# Patient Record
Sex: Female | Born: 1965 | Hispanic: No | Smoking: Current every day smoker
Health system: Southern US, Community
[De-identification: ages and names within clinical notes are randomized; demographics above are authoritative.]

## PROBLEM LIST (undated history)

## (undated) DIAGNOSIS — F419 Anxiety disorder, unspecified: Secondary | ICD-10-CM

## (undated) DIAGNOSIS — M539 Dorsopathy, unspecified: Secondary | ICD-10-CM

## (undated) DIAGNOSIS — F329 Major depressive disorder, single episode, unspecified: Secondary | ICD-10-CM

## (undated) DIAGNOSIS — Z8639 Personal history of other endocrine, nutritional and metabolic disease: Secondary | ICD-10-CM

## (undated) DIAGNOSIS — M25519 Pain in unspecified shoulder: Secondary | ICD-10-CM

## (undated) DIAGNOSIS — F509 Eating disorder, unspecified: Secondary | ICD-10-CM

## (undated) DIAGNOSIS — D649 Anemia, unspecified: Secondary | ICD-10-CM

## (undated) DIAGNOSIS — R569 Unspecified convulsions: Secondary | ICD-10-CM

## (undated) DIAGNOSIS — F32A Depression, unspecified: Secondary | ICD-10-CM

## (undated) DIAGNOSIS — G8929 Other chronic pain: Secondary | ICD-10-CM

## (undated) DIAGNOSIS — I10 Essential (primary) hypertension: Secondary | ICD-10-CM

## (undated) DIAGNOSIS — I82409 Acute embolism and thrombosis of unspecified deep veins of unspecified lower extremity: Secondary | ICD-10-CM

## (undated) HISTORY — PX: KNEE SURGERY: SHX244

## (undated) HISTORY — DX: Acute embolism and thrombosis of unspecified deep veins of unspecified lower extremity: I82.409

## (undated) HISTORY — DX: Personal history of other endocrine, nutritional and metabolic disease: Z86.39

## (undated) HISTORY — DX: Major depressive disorder, single episode, unspecified: F32.9

## (undated) HISTORY — DX: Eating disorder, unspecified: F50.9

## (undated) HISTORY — DX: Depression, unspecified: F32.A

## (undated) HISTORY — PX: MULTIPLE TOOTH EXTRACTIONS: SHX2053

## (undated) HISTORY — DX: Anxiety disorder, unspecified: F41.9

---

## 1999-12-01 ENCOUNTER — Emergency Department (HOSPITAL_COMMUNITY): Admission: EM | Admit: 1999-12-01 | Discharge: 1999-12-01 | Payer: Self-pay | Admitting: Emergency Medicine

## 1999-12-01 ENCOUNTER — Encounter: Payer: Self-pay | Admitting: Emergency Medicine

## 2000-05-12 ENCOUNTER — Other Ambulatory Visit: Admission: RE | Admit: 2000-05-12 | Discharge: 2000-05-12 | Payer: Self-pay | Admitting: *Deleted

## 2000-06-20 ENCOUNTER — Encounter: Admission: RE | Admit: 2000-06-20 | Discharge: 2000-06-20 | Payer: Self-pay | Admitting: Family Medicine

## 2003-03-09 ENCOUNTER — Emergency Department (HOSPITAL_COMMUNITY): Admission: EM | Admit: 2003-03-09 | Discharge: 2003-03-09 | Payer: Self-pay | Admitting: Emergency Medicine

## 2003-05-24 ENCOUNTER — Other Ambulatory Visit: Admission: RE | Admit: 2003-05-24 | Discharge: 2003-05-24 | Payer: Self-pay | Admitting: Gynecology

## 2003-07-04 ENCOUNTER — Encounter: Admission: RE | Admit: 2003-07-04 | Discharge: 2003-08-10 | Payer: Self-pay | Admitting: Family Medicine

## 2004-05-22 ENCOUNTER — Emergency Department (HOSPITAL_COMMUNITY): Admission: EM | Admit: 2004-05-22 | Discharge: 2004-05-22 | Payer: Self-pay | Admitting: Emergency Medicine

## 2006-03-10 ENCOUNTER — Emergency Department (HOSPITAL_COMMUNITY): Admission: EM | Admit: 2006-03-10 | Discharge: 2006-03-10 | Payer: Self-pay | Admitting: Emergency Medicine

## 2006-04-09 ENCOUNTER — Emergency Department (HOSPITAL_COMMUNITY): Admission: EM | Admit: 2006-04-09 | Discharge: 2006-04-09 | Payer: Self-pay | Admitting: Emergency Medicine

## 2006-05-30 ENCOUNTER — Emergency Department (HOSPITAL_COMMUNITY): Admission: EM | Admit: 2006-05-30 | Discharge: 2006-05-30 | Payer: Self-pay | Admitting: Emergency Medicine

## 2006-06-28 ENCOUNTER — Emergency Department (HOSPITAL_COMMUNITY): Admission: EM | Admit: 2006-06-28 | Discharge: 2006-06-28 | Payer: Self-pay | Admitting: Emergency Medicine

## 2006-07-28 ENCOUNTER — Emergency Department (HOSPITAL_COMMUNITY): Admission: EM | Admit: 2006-07-28 | Discharge: 2006-07-28 | Payer: Self-pay | Admitting: Emergency Medicine

## 2006-09-03 ENCOUNTER — Emergency Department (HOSPITAL_COMMUNITY): Admission: EM | Admit: 2006-09-03 | Discharge: 2006-09-03 | Payer: Self-pay | Admitting: Emergency Medicine

## 2007-02-21 ENCOUNTER — Emergency Department (HOSPITAL_COMMUNITY): Admission: EM | Admit: 2007-02-21 | Discharge: 2007-02-21 | Payer: Self-pay | Admitting: Emergency Medicine

## 2007-03-03 ENCOUNTER — Emergency Department (HOSPITAL_COMMUNITY): Admission: EM | Admit: 2007-03-03 | Discharge: 2007-03-03 | Payer: Self-pay | Admitting: *Deleted

## 2007-03-18 ENCOUNTER — Emergency Department (HOSPITAL_COMMUNITY): Admission: EM | Admit: 2007-03-18 | Discharge: 2007-03-18 | Payer: Self-pay | Admitting: *Deleted

## 2007-06-21 ENCOUNTER — Emergency Department (HOSPITAL_COMMUNITY): Admission: EM | Admit: 2007-06-21 | Discharge: 2007-06-21 | Payer: Self-pay | Admitting: Emergency Medicine

## 2007-08-27 ENCOUNTER — Emergency Department (HOSPITAL_COMMUNITY): Admission: EM | Admit: 2007-08-27 | Discharge: 2007-08-27 | Payer: Self-pay | Admitting: Emergency Medicine

## 2007-09-21 ENCOUNTER — Emergency Department (HOSPITAL_COMMUNITY): Admission: EM | Admit: 2007-09-21 | Discharge: 2007-09-21 | Payer: Self-pay | Admitting: Family Medicine

## 2007-12-13 ENCOUNTER — Emergency Department (HOSPITAL_COMMUNITY): Admission: EM | Admit: 2007-12-13 | Discharge: 2007-12-13 | Payer: Self-pay | Admitting: Emergency Medicine

## 2008-04-29 ENCOUNTER — Emergency Department (HOSPITAL_COMMUNITY): Admission: EM | Admit: 2008-04-29 | Discharge: 2008-04-29 | Payer: Self-pay | Admitting: Emergency Medicine

## 2008-05-28 ENCOUNTER — Emergency Department (HOSPITAL_COMMUNITY): Admission: EM | Admit: 2008-05-28 | Discharge: 2008-05-28 | Payer: Self-pay | Admitting: Emergency Medicine

## 2008-07-27 ENCOUNTER — Emergency Department (HOSPITAL_COMMUNITY): Admission: EM | Admit: 2008-07-27 | Discharge: 2008-07-27 | Payer: Self-pay | Admitting: Emergency Medicine

## 2008-09-13 ENCOUNTER — Emergency Department (HOSPITAL_COMMUNITY): Admission: EM | Admit: 2008-09-13 | Discharge: 2008-09-13 | Payer: Self-pay | Admitting: Emergency Medicine

## 2008-10-21 ENCOUNTER — Emergency Department (HOSPITAL_COMMUNITY): Admission: EM | Admit: 2008-10-21 | Discharge: 2008-10-21 | Payer: Self-pay | Admitting: Emergency Medicine

## 2008-11-13 ENCOUNTER — Emergency Department (HOSPITAL_COMMUNITY): Admission: EM | Admit: 2008-11-13 | Discharge: 2008-11-13 | Payer: Self-pay | Admitting: Emergency Medicine

## 2008-11-18 ENCOUNTER — Emergency Department (HOSPITAL_COMMUNITY): Admission: EM | Admit: 2008-11-18 | Discharge: 2008-11-18 | Payer: Self-pay | Admitting: Emergency Medicine

## 2008-12-28 ENCOUNTER — Emergency Department (HOSPITAL_COMMUNITY): Admission: EM | Admit: 2008-12-28 | Discharge: 2008-12-28 | Payer: Self-pay | Admitting: Emergency Medicine

## 2009-01-09 ENCOUNTER — Emergency Department (HOSPITAL_COMMUNITY): Admission: EM | Admit: 2009-01-09 | Discharge: 2009-01-09 | Payer: Self-pay | Admitting: Emergency Medicine

## 2009-01-20 ENCOUNTER — Emergency Department (HOSPITAL_COMMUNITY): Admission: EM | Admit: 2009-01-20 | Discharge: 2009-01-20 | Payer: Self-pay | Admitting: Emergency Medicine

## 2009-03-07 ENCOUNTER — Emergency Department (HOSPITAL_COMMUNITY): Admission: EM | Admit: 2009-03-07 | Discharge: 2009-03-07 | Payer: Self-pay | Admitting: Emergency Medicine

## 2009-05-13 ENCOUNTER — Emergency Department (HOSPITAL_COMMUNITY): Admission: EM | Admit: 2009-05-13 | Discharge: 2009-05-13 | Payer: Self-pay | Admitting: Emergency Medicine

## 2009-11-06 ENCOUNTER — Emergency Department (HOSPITAL_COMMUNITY): Admission: EM | Admit: 2009-11-06 | Discharge: 2009-11-06 | Payer: Self-pay | Admitting: Emergency Medicine

## 2010-08-27 ENCOUNTER — Emergency Department (HOSPITAL_COMMUNITY)
Admission: EM | Admit: 2010-08-27 | Discharge: 2010-08-28 | Payer: Self-pay | Source: Home / Self Care | Admitting: Emergency Medicine

## 2010-08-28 LAB — DIFFERENTIAL
Basophils Absolute: 0 10*3/uL (ref 0.0–0.1)
Basophils Relative: 0 % (ref 0–1)
Eosinophils Absolute: 0.2 10*3/uL (ref 0.0–0.7)
Eosinophils Relative: 3 % (ref 0–5)
Monocytes Absolute: 0.6 10*3/uL (ref 0.1–1.0)
Monocytes Relative: 7 % (ref 3–12)

## 2010-08-28 LAB — URINE MICROSCOPIC-ADD ON

## 2010-08-28 LAB — CBC
HCT: 38.9 % (ref 36.0–46.0)
MCHC: 32.9 g/dL (ref 30.0–36.0)
Platelets: 259 10*3/uL (ref 150–400)
RBC: 4.69 MIL/uL (ref 3.87–5.11)
WBC: 8.1 10*3/uL (ref 4.0–10.5)

## 2010-08-28 LAB — POCT I-STAT, CHEM 8
Chloride: 104 mEq/L (ref 96–112)
Sodium: 142 mEq/L (ref 135–145)

## 2010-08-28 LAB — URINALYSIS, ROUTINE W REFLEX MICROSCOPIC
Specific Gravity, Urine: 1.031 — ABNORMAL HIGH (ref 1.005–1.030)
Urine Glucose, Fasting: NEGATIVE mg/dL
pH: 6 (ref 5.0–8.0)

## 2010-08-29 LAB — URINE CULTURE

## 2011-01-08 ENCOUNTER — Ambulatory Visit
Admission: RE | Admit: 2011-01-08 | Discharge: 2011-01-08 | Disposition: A | Discharge status: Home / Self Care | Payer: No Typology Code available for payment source | Source: Ambulatory Visit | Attending: Family Medicine | Admitting: Family Medicine

## 2011-01-08 ENCOUNTER — Other Ambulatory Visit: Payer: Self-pay | Admitting: Family Medicine

## 2011-01-08 DIAGNOSIS — Z201 Contact with and (suspected) exposure to tuberculosis: Secondary | ICD-10-CM

## 2011-04-25 LAB — URINE MICROSCOPIC-ADD ON

## 2011-04-25 LAB — COMPREHENSIVE METABOLIC PANEL
Albumin: 3.9
Alkaline Phosphatase: 79
Chloride: 107
GFR calc Af Amer: >60
GFR calc non Af Amer: >60
Potassium: 4
Sodium: 141
Total Bilirubin: 0.8
Total Protein: 6.9

## 2011-04-25 LAB — CBC
MCHC: 33.3
MCV: 85
Platelets: 281
RDW: 14.6
WBC: 6.2

## 2011-04-25 LAB — DIFFERENTIAL
Basophils Relative: 1
Monocytes Relative: 5
Neutro Abs: 3.4
Neutrophils Relative %: 54

## 2011-04-25 LAB — URINALYSIS, ROUTINE W REFLEX MICROSCOPIC
Bilirubin Urine: NEGATIVE
Hgb urine dipstick: NEGATIVE

## 2011-04-25 LAB — LIPASE, BLOOD: Lipase: 20

## 2011-04-26 LAB — POCT PREGNANCY, URINE: Preg Test, Ur: NEGATIVE

## 2011-12-20 ENCOUNTER — Encounter: Payer: PRIVATE HEALTH INSURANCE | Admitting: Obstetrics and Gynecology

## 2012-07-23 ENCOUNTER — Emergency Department (HOSPITAL_COMMUNITY)
Admission: EM | Admit: 2012-07-23 | Discharge: 2012-07-23 | Disposition: A | Discharge status: Home / Self Care | Payer: PRIVATE HEALTH INSURANCE | Attending: Emergency Medicine | Admitting: Emergency Medicine

## 2012-07-23 ENCOUNTER — Encounter (HOSPITAL_COMMUNITY): Payer: Self-pay | Admitting: *Deleted

## 2012-07-23 DIAGNOSIS — Y9389 Activity, other specified: Secondary | ICD-10-CM | POA: Insufficient documentation

## 2012-07-23 DIAGNOSIS — S335XXA Sprain of ligaments of lumbar spine, initial encounter: Secondary | ICD-10-CM | POA: Insufficient documentation

## 2012-07-23 DIAGNOSIS — S39012A Strain of muscle, fascia and tendon of lower back, initial encounter: Secondary | ICD-10-CM

## 2012-07-23 DIAGNOSIS — G8929 Other chronic pain: Secondary | ICD-10-CM | POA: Insufficient documentation

## 2012-07-23 DIAGNOSIS — Z8669 Personal history of other diseases of the nervous system and sense organs: Secondary | ICD-10-CM | POA: Insufficient documentation

## 2012-07-23 DIAGNOSIS — F17200 Nicotine dependence, unspecified, uncomplicated: Secondary | ICD-10-CM | POA: Insufficient documentation

## 2012-07-23 DIAGNOSIS — Y929 Unspecified place or not applicable: Secondary | ICD-10-CM | POA: Insufficient documentation

## 2012-07-23 DIAGNOSIS — X500XXA Overexertion from strenuous movement or load, initial encounter: Secondary | ICD-10-CM | POA: Insufficient documentation

## 2012-07-23 HISTORY — DX: Dorsopathy, unspecified: M53.9

## 2012-07-23 HISTORY — DX: Unspecified convulsions: R56.9

## 2012-07-23 MED ORDER — CYCLOBENZAPRINE HCL 10 MG PO TABS: 10.0000 mg | ORAL_TABLET | Freq: Three times a day (TID) | ORAL | Status: DC | PRN

## 2012-07-23 MED ORDER — OXYCODONE-ACETAMINOPHEN 5-325 MG PO TABS
2.0000 | ORAL_TABLET | Freq: Once | ORAL | Status: AC
  Administered 2012-07-23: 2 via ORAL
  Filled 2012-07-23: qty 1

## 2012-07-23 MED ORDER — CYCLOBENZAPRINE HCL 10 MG PO TABS
10.0000 mg | ORAL_TABLET | Freq: Once | ORAL | Status: AC
  Administered 2012-07-23: 10 mg via ORAL
  Filled 2012-07-23: qty 1

## 2012-07-23 MED ORDER — OXYCODONE-ACETAMINOPHEN 10-325 MG PO TABS: 1.0000 | ORAL_TABLET | ORAL | Status: DC | PRN

## 2012-07-23 MED ORDER — OXYCODONE-ACETAMINOPHEN 5-325 MG PO TABS
ORAL_TABLET | ORAL | Status: AC
  Filled 2012-07-23: qty 1

## 2012-07-23 NOTE — ED Notes (Signed)
Pt was "rolling " large amts of hay yesterday, back pain since then.

## 2012-07-23 NOTE — ED Notes (Signed)
Left in c/o friend for transport home; instructions, prescriptions and f/u information given/reviewed - verbalizes understanding.  

## 2012-07-23 NOTE — ED Provider Notes (Signed)
History     CSN: [id number]  Arrival date & time 07/23/12  1038   First MD Initiated Contact with Patient 07/23/12 1117      Chief Complaint  Patient presents with  . Back Pain    (Consider location/radiation/quality/duration/timing/severity/associated sxs/prior treatment) HPI Comments: Patient with a history of chronic low back pain complains of sudden onset of increased pain to her right lower back that began yesterday while pushing a large pale of hay.  States she did not fall. She states the pain is worse with movement and improves with sitting up. She states pain is similar to her previous back problems. She states she contacted her orthopedic physician today but was advised to come to the ER. She denies any radiation of pain, incontinence, dysuria, abdominal pain, saddle anesthesias, numbness or weakness to the lower extremities.  Patient is a 46 y.o. female presenting with back pain. The history is provided by the patient.  Back Pain  This is a new problem. The current episode started yesterday. The problem occurs constantly. The problem has not changed since onset.The pain is associated with twisting (Pushing). The pain is present in the lumbar spine. The quality of the pain is described as aching. The pain does not radiate. The pain is moderate. The symptoms are aggravated by bending, twisting and certain positions. Pertinent negatives include no chest pain, no fever, no numbness, no abdominal pain, no abdominal swelling, no bowel incontinence, no perianal numbness, no bladder incontinence, no dysuria, no pelvic pain, no leg pain, no paresthesias, no paresis, no tingling and no weakness. She has tried nothing for the symptoms. The treatment provided no relief.    Past Medical History  Diagnosis Date  . Seizures   . Back problem     History reviewed. No pertinent past surgical history.  History reviewed. No pertinent family history.  History  Substance Use Topics  . Smoking  status: Current Every Day Smoker  . Smokeless tobacco: Not on file  . Alcohol Use: No    OB History    Grav Para Term Preterm Abortions TAB SAB Ect Mult Living                  Review of Systems  Constitutional: Negative for fever and appetite change.  Respiratory: Negative for shortness of breath.   Cardiovascular: Negative for chest pain.  Gastrointestinal: Negative for vomiting, abdominal pain, constipation and bowel incontinence.  Genitourinary: Negative for bladder incontinence, dysuria, hematuria, flank pain, decreased urine volume, difficulty urinating and pelvic pain.       No perineal numbness or incontinence of urine or feces  Musculoskeletal: Positive for back pain. Negative for joint swelling.  Skin: Negative for rash.  Neurological: Negative for tingling, weakness, numbness and paresthesias.  All other systems reviewed and are negative.    Allergies  Penicillins  Home Medications  No current outpatient prescriptions on file.  BP 135/79  Pulse 94  Temp 98.4 F (36.9 C) (Oral)  Resp 18  Ht 5' 7.5" (1.715 m)  Wt 173 lb (78.472 kg)  BMI 26.70 kg/m2  SpO2 100%  Physical Exam  Nursing note and vitals reviewed. Constitutional: She is oriented to person, place, and time. She appears well-developed and well-nourished. No distress.  HENT:  Head: Normocephalic and atraumatic.  Neck: Normal range of motion. Neck supple.  Cardiovascular: Normal rate, regular rhythm, normal heart sounds and intact distal pulses.   No murmur heard. Pulmonary/Chest: Effort normal and breath sounds normal.  Musculoskeletal: She  exhibits tenderness. She exhibits no edema.       Lumbar back: She exhibits tenderness and pain. She exhibits normal range of motion, no swelling, no deformity, no laceration and normal pulse.       Back:       Tenderness to palpation of the right lumbar paraspinal muscles. Mild tenderness is also present at the right SI joint. DP pulses are strong and brisk  bilaterally, distal sensation is intact. Pain is reproduced with straight-leg raise on the right.  Neurological: She is alert and oriented to person, place, and time. No sensory deficit. She exhibits normal muscle tone. Coordination and gait normal.  Reflex Scores:      Patellar reflexes are 2+ on the right side and 2+ on the left side.      Achilles reflexes are 2+ on the right side and 2+ on the left side. Skin: Skin is warm and dry.    ED Course  Procedures (including critical care time)  Labs Reviewed - No data to display No results found.      MDM    Patient has ttp of the right lumbar paraspinal muscles.  No focal neuro deficits on exam.  Ambulates with a steady gait.     Patient agrees to alternate ice and heat to her back, close followup with her orthopedic physician. doubt emergent neurological or infectious process.  Prescribed: Percocet #20 Flexeril      Candus Braud L. Tynesia Harral, PA 07/23/12 1145  Daven Montz L. Shulamit Donofrio, [place] 07/23/12 1149

## 2012-07-24 NOTE — ED Provider Notes (Signed)
Medical screening examination/treatment/procedure(s) were performed by non-physician practitioner and as supervising physician I was immediately available for consultation/collaboration.  Donnetta Hutching, MD 07/24/12 [phone]

## 2012-07-31 ENCOUNTER — Encounter (HOSPITAL_COMMUNITY): Payer: Self-pay | Admitting: *Deleted

## 2012-07-31 ENCOUNTER — Emergency Department (HOSPITAL_COMMUNITY)
Admission: EM | Admit: 2012-07-31 | Discharge: 2012-07-31 | Disposition: A | Discharge status: Home / Self Care | Payer: PRIVATE HEALTH INSURANCE | Attending: Emergency Medicine | Admitting: Emergency Medicine

## 2012-07-31 DIAGNOSIS — Z8669 Personal history of other diseases of the nervous system and sense organs: Secondary | ICD-10-CM | POA: Insufficient documentation

## 2012-07-31 DIAGNOSIS — R259 Unspecified abnormal involuntary movements: Secondary | ICD-10-CM | POA: Insufficient documentation

## 2012-07-31 DIAGNOSIS — R6884 Jaw pain: Secondary | ICD-10-CM | POA: Insufficient documentation

## 2012-07-31 DIAGNOSIS — R221 Localized swelling, mass and lump, neck: Secondary | ICD-10-CM | POA: Insufficient documentation

## 2012-07-31 DIAGNOSIS — K0889 Other specified disorders of teeth and supporting structures: Secondary | ICD-10-CM

## 2012-07-31 DIAGNOSIS — F17200 Nicotine dependence, unspecified, uncomplicated: Secondary | ICD-10-CM | POA: Insufficient documentation

## 2012-07-31 DIAGNOSIS — K089 Disorder of teeth and supporting structures, unspecified: Secondary | ICD-10-CM | POA: Insufficient documentation

## 2012-07-31 DIAGNOSIS — R220 Localized swelling, mass and lump, head: Secondary | ICD-10-CM | POA: Insufficient documentation

## 2012-07-31 MED ORDER — CLINDAMYCIN HCL 150 MG PO CAPS: 300.0000 mg | ORAL_CAPSULE | Freq: Three times a day (TID) | ORAL | Status: DC

## 2012-07-31 MED ORDER — CLINDAMYCIN PHOSPHATE 300 MG/50ML IV SOLN: 300.0000 mg | Freq: Once | INTRAVENOUS | Status: DC

## 2012-07-31 MED ORDER — CLINDAMYCIN HCL 150 MG PO CAPS
ORAL_CAPSULE | ORAL | Status: AC
  Administered 2012-07-31: 300 mg
  Filled 2012-07-31: qty 2

## 2012-07-31 MED ORDER — HYDROCODONE-ACETAMINOPHEN 5-325 MG PO TABS
1.0000 | ORAL_TABLET | Freq: Once | ORAL | Status: AC
  Administered 2012-07-31: 1 via ORAL
  Filled 2012-07-31: qty 1

## 2012-07-31 MED ORDER — HYDROCODONE-ACETAMINOPHEN 5-325 MG PO TABS: 1.0000 | ORAL_TABLET | Freq: Four times a day (QID) | ORAL | Status: AC | PRN

## 2012-07-31 NOTE — ED Notes (Signed)
Dental pain for 3 days, plans to have teeth extracted 1/8.

## 2012-07-31 NOTE — ED Provider Notes (Signed)
History     CSN: [id number]  Arrival date & time 07/31/12  1732   First MD Initiated Contact with Patient 07/31/12 2031      Chief Complaint  Patient presents with  . Dental Pain    (Consider location/radiation/quality/duration/timing/severity/associated sxs/prior treatment) HPI Comments: Has appt to have all teeth extracted 08-18-12.  Patient is a 46 y.o. female presenting with tooth pain. The history is provided by the patient. No language interpreter was used.  Dental PainThe primary symptoms include mouth pain. Primary symptoms do not include dental injury or fever. Episode onset: couple weeks ago. The symptoms are unchanged. The symptoms occur constantly.  Additional symptoms include: jaw pain. Additional symptoms do not include: gum swelling, trismus and facial swelling.    Past Medical History  Diagnosis Date  . Seizures   . Back problem     History reviewed. No pertinent past surgical history.  No family history on file.  History  Substance Use Topics  . Smoking status: Current Every Day Smoker  . Smokeless tobacco: Not on file  . Alcohol Use: No    OB History    Grav Para Term Preterm Abortions TAB SAB Ect Mult Living                  Review of Systems  Constitutional: Negative for fever and chills.  HENT: Positive for dental problem. Negative for facial swelling.   Cardiovascular: Negative for chest pain.  All other systems reviewed and are negative.    Allergies  Penicillins  Home Medications   Current Outpatient Rx  Name  Route  Sig  Dispense  Refill  . CLINDAMYCIN HCL 150 MG PO CAPS   Oral   Take 2 capsules (300 mg total) by mouth 3 (three) times daily.   60 capsule   0   . HYDROCODONE-ACETAMINOPHEN 5-325 MG PO TABS   Oral   Take 1 tablet by mouth every 6 (six) hours as needed for pain.   20 tablet   0     BP 147/83  Pulse 97  Temp 98.3 F (36.8 C) (Oral)  Resp 16  SpO2 100%  Physical Exam  Nursing note and vitals  reviewed. Constitutional: She is oriented to person, place, and time. She appears well-developed and well-nourished. No distress.  HENT:  Head: Normocephalic and atraumatic.       Multiple decayed teeth scattered through mouth.  Eyes: EOM are normal.  Neck: Normal range of motion.  Cardiovascular: Normal rate and regular rhythm.   Pulmonary/Chest: Effort normal.  Abdominal: Soft. She exhibits no distension. There is no tenderness.  Musculoskeletal: Normal range of motion.  Neurological: She is alert and oriented to person, place, and time.  Skin: Skin is warm and dry.  Psychiatric: She has a normal mood and affect. Judgment normal.    ED Course  Procedures (including critical care time)  Labs Reviewed - No data to display No results found.   1. Pain, dental       MDM  rx-clindamycin  rx-hydrocodone F/u with dentist as planned.        Evalina Field, [place] 07/31/12 2039

## 2012-08-01 NOTE — ED Provider Notes (Signed)
Medical screening examination/treatment/procedure(s) were performed by non-physician practitioner and as supervising physician I was immediately available for consultation/collaboration.  Donnetta Hutching, MD 08/01/12 [phone]

## 2012-09-09 ENCOUNTER — Emergency Department (HOSPITAL_COMMUNITY)
Admission: EM | Admit: 2012-09-09 | Discharge: 2012-09-09 | Disposition: A | Discharge status: Home / Self Care | Payer: PRIVATE HEALTH INSURANCE | Attending: Emergency Medicine | Admitting: Emergency Medicine

## 2012-09-09 ENCOUNTER — Encounter (HOSPITAL_COMMUNITY): Payer: Self-pay

## 2012-09-09 ENCOUNTER — Emergency Department (HOSPITAL_COMMUNITY): Payer: PRIVATE HEALTH INSURANCE

## 2012-09-09 DIAGNOSIS — Z79899 Other long term (current) drug therapy: Secondary | ICD-10-CM | POA: Insufficient documentation

## 2012-09-09 DIAGNOSIS — S93501A Unspecified sprain of right great toe, initial encounter: Secondary | ICD-10-CM

## 2012-09-09 DIAGNOSIS — S93609A Unspecified sprain of unspecified foot, initial encounter: Secondary | ICD-10-CM | POA: Insufficient documentation

## 2012-09-09 DIAGNOSIS — Y929 Unspecified place or not applicable: Secondary | ICD-10-CM | POA: Insufficient documentation

## 2012-09-09 DIAGNOSIS — IMO0002 Reserved for concepts with insufficient information to code with codable children: Secondary | ICD-10-CM | POA: Insufficient documentation

## 2012-09-09 DIAGNOSIS — G40909 Epilepsy, unspecified, not intractable, without status epilepticus: Secondary | ICD-10-CM | POA: Insufficient documentation

## 2012-09-09 DIAGNOSIS — Y939 Activity, unspecified: Secondary | ICD-10-CM | POA: Insufficient documentation

## 2012-09-09 DIAGNOSIS — F17200 Nicotine dependence, unspecified, uncomplicated: Secondary | ICD-10-CM | POA: Insufficient documentation

## 2012-09-09 MED ORDER — OXYCODONE-ACETAMINOPHEN 5-325 MG PO TABS: 1.0000 | ORAL_TABLET | ORAL | Status: AC | PRN

## 2012-09-09 NOTE — ED Notes (Signed)
Injured right great toe 1 month ago, hit on door, pain cont. And wants toe checked.

## 2012-09-09 NOTE — Discharge Instructions (Signed)
Joint Sprain A sprain is a tear or stretch in the ligaments that hold a joint together. Severe sprains may need as long as 3-6 weeks of immobilization and/or exercises to heal completely. Sprained joints should be rested and protected. If not, they can become unstable and prone to re-injury. Proper treatment can reduce your pain, shorten the period of disability, and reduce the risk of repeated injuries. TREATMENT   Rest and elevate the injured joint to reduce pain and swelling.  Apply ice packs to the injury for 20-30 minutes every 2-3 hours for the next 2-3 days.  Keep the injury wrapped in a compression bandage or splint as long as the joint is painful or as instructed by your caregiver.  Do not use the injured joint until it is completely healed to prevent re-injury and chronic instability. Follow the instructions of your caregiver.  Long-term sprain management may require exercises and/or treatment by a physical therapist. Taping or special braces may help stabilize the joint until it is completely better. SEEK MEDICAL CARE IF:   You develop increased pain or swelling of the joint.  You develop increasing redness and warmth of the joint.  You develop a fever.  It becomes stiff.  Your hand or foot gets cold or numb. Document Released: 08/29/2004 Document Revised: 10/14/2011 Document Reviewed: 08/08/2008 [facility] Patient Information 2013 [place], [place].   You may take the oxycodone prescribed for pain relief.  This will make you drowsy - do not drive within 4 hours of taking this medication. Wear the postop shoe for comfort.  You may find that wearing a thick sock or bulky dressing may also help to minimize pain and stress from everyday walking.  Follow up with your doctor as planned.

## 2012-09-12 NOTE — ED Provider Notes (Signed)
History     CSN: [id number]  Arrival date & time 09/09/12  1624   First MD Initiated Contact with Patient 09/09/12 1726      Chief Complaint  Patient presents with  . Toe Pain    (Consider location/radiation/quality/duration/timing/severity/associated sxs/prior treatment) HPI Comments: Janet Cohen is a 47 y.o. Female presenting with right great toe pain after hitting it against a door 1 month ago.     Patient is a 47 y.o. female presenting with toe pain. The history is provided by the patient.  Toe Pain This is a chronic problem. The current episode started 1 to 4 weeks ago. The problem occurs constantly. The problem has been unchanged. Associated symptoms include arthralgias. Pertinent negatives include no joint swelling, numbness or weakness. The symptoms are aggravated by bending, standing and walking. She has tried rest, heat and NSAIDs for the symptoms. The treatment provided no relief.    Past Medical History  Diagnosis Date  . Seizures   . Back problem     History reviewed. No pertinent past surgical history.  No family history on file.  History  Substance Use Topics  . Smoking status: Current Every Day Smoker  . Smokeless tobacco: Not on file  . Alcohol Use: No    OB History   Grav Para Term Preterm Abortions TAB SAB Ect Mult Living                  Review of Systems  Musculoskeletal: Positive for arthralgias. Negative for joint swelling.  Skin: Negative for wound.  Neurological: Negative for weakness and numbness.    Allergies  Penicillins  Home Medications   Current Outpatient Rx  Name  Route  Sig  Dispense  Refill  . divalproex (DEPAKOTE) 500 MG DR tablet   Oral   Take 500 mg by mouth 3 (three) times daily.         . medroxyPROGESTERone (DEPO-PROVERA) 150 MG/ML injection   Intramuscular   Inject 150 mg into the muscle every 3 (three) months.         Marland Kitchen oxyCODONE-acetaminophen (PERCOCET/ROXICET) 5-325 MG per tablet   Oral   Take 1  tablet by mouth every 4 (four) hours as needed for pain.   20 tablet   0     BP 125/66  Pulse 91  Temp(Src) 98 F (36.7 C) (Oral)  Resp 20  Ht 5\' 7"  (1.702 m)  Wt 180 lb (81.647 kg)  BMI 28.19 kg/m2  SpO2 100%  Physical Exam  Constitutional: She appears well-developed and well-nourished.  HENT:  Head: Atraumatic.  Neck: Normal range of motion.  Cardiovascular:  Pulses:      Dorsalis pedis pulses are 2+ on the right side, and 2+ on the left side.  Pulses equal bilaterally  Musculoskeletal: She exhibits tenderness. She exhibits no edema.  Pain at right mtp joint with active or passive ROM and palpation.  slight edema,  No erythema or visible deformity.  Cap refill less than 3 sec.  Neurological: She is alert. She has normal strength. She displays normal reflexes. No sensory deficit.  Equal strength  Skin: Skin is warm and dry.  Psychiatric: She has a normal mood and affect.    ED Course  Procedures (including critical care time)  Labs Reviewed - No data to display No results found.   1. Sprain of toe, great, right       MDM  Chronic pain in right great toe after injury with slight edema, no erythema  and normal xrays today.  She was placed in post op shoe,  But still had complaint of pain with attempts to weight bear.  She was given a set of crutches,  Encouraged f/u with ortho for further management.  Suspect persistent strain/sprain which stays inflammed due to overuse.    Patients labs and/or radiological studies were reviewed during the medical decision making and disposition process.         Burgess Amor, PA 09/12/12 1344

## 2012-09-13 NOTE — ED Provider Notes (Signed)
Medical screening examination/treatment/procedure(s) were performed by non-physician practitioner and as supervising physician I was immediately available for consultation/collaboration.   Shelda Jakes, MD 09/13/12 1021

## 2012-10-05 ENCOUNTER — Emergency Department (HOSPITAL_COMMUNITY): Payer: PRIVATE HEALTH INSURANCE

## 2012-10-05 ENCOUNTER — Emergency Department (HOSPITAL_COMMUNITY)
Admission: EM | Admit: 2012-10-05 | Discharge: 2012-10-05 | Disposition: A | Discharge status: Home / Self Care | Payer: PRIVATE HEALTH INSURANCE | Attending: Emergency Medicine | Admitting: Emergency Medicine

## 2012-10-05 ENCOUNTER — Encounter (HOSPITAL_COMMUNITY): Payer: Self-pay

## 2012-10-05 DIAGNOSIS — R209 Unspecified disturbances of skin sensation: Secondary | ICD-10-CM | POA: Insufficient documentation

## 2012-10-05 DIAGNOSIS — IMO0001 Reserved for inherently not codable concepts without codable children: Secondary | ICD-10-CM | POA: Insufficient documentation

## 2012-10-05 DIAGNOSIS — Y9352 Activity, horseback riding: Secondary | ICD-10-CM | POA: Insufficient documentation

## 2012-10-05 DIAGNOSIS — F17200 Nicotine dependence, unspecified, uncomplicated: Secondary | ICD-10-CM | POA: Insufficient documentation

## 2012-10-05 DIAGNOSIS — Y9289 Other specified places as the place of occurrence of the external cause: Secondary | ICD-10-CM | POA: Insufficient documentation

## 2012-10-05 DIAGNOSIS — IMO0002 Reserved for concepts with insufficient information to code with codable children: Secondary | ICD-10-CM | POA: Insufficient documentation

## 2012-10-05 DIAGNOSIS — S298XXA Other specified injuries of thorax, initial encounter: Secondary | ICD-10-CM | POA: Insufficient documentation

## 2012-10-05 DIAGNOSIS — Z79899 Other long term (current) drug therapy: Secondary | ICD-10-CM | POA: Insufficient documentation

## 2012-10-05 DIAGNOSIS — G40909 Epilepsy, unspecified, not intractable, without status epilepticus: Secondary | ICD-10-CM | POA: Insufficient documentation

## 2012-10-05 MED ORDER — FENTANYL CITRATE 0.05 MG/ML IJ SOLN
50.0000 ug | Freq: Once | INTRAMUSCULAR | Status: AC
  Administered 2012-10-05: 50 ug via NASAL
  Filled 2012-10-05: qty 2

## 2012-10-05 MED ORDER — OXYCODONE-ACETAMINOPHEN 5-325 MG PO TABS: 1.0000 | ORAL_TABLET | ORAL | Status: DC | PRN

## 2012-10-05 MED ORDER — HYDROMORPHONE HCL PF 1 MG/ML IJ SOLN
1.0000 mg | Freq: Once | INTRAMUSCULAR | Status: DC
  Filled 2012-10-05: qty 1

## 2012-10-05 MED ORDER — OXYCODONE-ACETAMINOPHEN 5-325 MG PO TABS
1.0000 | ORAL_TABLET | Freq: Once | ORAL | Status: AC
  Administered 2012-10-05: 1 via ORAL
  Filled 2012-10-05: qty 1

## 2012-10-05 NOTE — ED Provider Notes (Signed)
History     CSN: [id number]  Arrival date & time 10/05/12  1211   First MD Initiated Contact with Patient 10/05/12 1305      Chief Complaint  Patient presents with  . Shoulder Injury     Patient is a 47 y.o. female presenting with shoulder injury. The history is provided by the patient.  Shoulder Injury This is a new problem. The current episode started yesterday. The problem occurs constantly. The problem has been gradually worsening. Associated symptoms include chest pain. Pertinent negatives include no abdominal pain, no headaches and no shortness of breath. The symptoms are aggravated by twisting. The symptoms are relieved by rest. She has tried rest for the symptoms. The treatment provided no relief.  Pt reports while riding her horse yesterday she fell and the horse landed on her right shoulder.  No LOC.  No head injury.  No neck or back injury.  No focal weakness.  No abd pain.  No vomiting.  She now reports right shoulder and right chest wall injury.  She reports numbness to right hand but that has resolved.  No focal weakness reported.    Past Medical History  Diagnosis Date  . Seizures   . Back problem     Past Surgical History  Procedure Laterality Date  . Knee surgery    . Kidney surgery    . Cholecystectomy      History reviewed. No pertinent family history.  History  Substance Use Topics  . Smoking status: Current Every Day Smoker  . Smokeless tobacco: Not on file  . Alcohol Use: No    OB History   Grav Para Term Preterm Abortions TAB SAB Ect Mult Living                  Review of Systems  Constitutional: Negative for fever and fatigue.  Respiratory: Negative for shortness of breath.   Cardiovascular: Positive for chest pain.  Gastrointestinal: Negative for abdominal pain.  Musculoskeletal: Positive for myalgias. Negative for back pain.  Skin: Negative for color change.  Neurological: Positive for numbness. Negative for weakness and headaches.   Psychiatric/Behavioral: Negative for agitation.  All other systems reviewed and are negative.    Allergies  Penicillins  Home Medications   Current Outpatient Rx  Name  Route  Sig  Dispense  Refill  . divalproex (DEPAKOTE) 500 MG DR tablet   Oral   Take 500 mg by mouth 3 (three) times daily.         . Multiple Vitamin (MULTIVITAMIN WITH MINERALS) TABS   Oral   Take 1 tablet by mouth daily.         . medroxyPROGESTERone (DEPO-PROVERA) 150 MG/ML injection   Intramuscular   Inject 150 mg into the muscle every 3 (three) months.         Marland Kitchen oxyCODONE-acetaminophen (PERCOCET/ROXICET) 5-325 MG per tablet   Oral   Take 1 tablet by mouth every 4 (four) hours as needed for pain.   15 tablet   0     BP 141/75  Pulse 107  Temp(Src) 97.8 F (36.6 C) (Oral)  Resp 18  Ht 5\' 6"  (1.676 m)  Wt 175 lb (79.379 kg)  BMI 28.26 kg/m2  SpO2 100%  Physical Exam CONSTITUTIONAL: Well developed/well nourished HEAD: Normocephalic/atraumatic EYES: EOMI/PERRL ENMT: Mucous membranes moist, No evidence of facial/nasal trauma NECK: supple no meningeal signs SPINE:entire spine nontender, No bruising/crepitance/stepoffs noted to spine NEXUS criteria met CV: S1/S2 noted, no murmurs/rubs/gallops noted Chest -  tender to palpation of right chest wall but no crepitance or bruising is noted.   LUNGS: Lungs are clear to auscultation bilaterally, no apparent distress ABDOMEN: soft, nontender, no rebound or guarding GU:no cva tenderness NEURO: Pt is awake/alert, moves all extremitiesx4, GCS 15.  Distal motor/sensory intact on right UE EXTREMITIES: pulses normal, full ROM. Tenderness to palpation of right shoulder.  No deformity and she can abduct at the right shoulder.  All other extremities/joints palpated/ranged and nontender SKIN: warm, color normal PSYCH: no abnormalities of mood noted  ED Course  Procedures (including critical care time)  Labs Reviewed - No data to display Dg Ribs  Unilateral W/chest Right  10/05/2012  *RADIOLOGY REPORT*  Clinical Data: Fall.  Right rib pain.  RIGHT RIBS AND CHEST - 3+ VIEW  Comparison: Plain film chest 11/13/2008.  Findings: Lungs are clear.  Heart size is normal.  No pneumothorax or pleural fluid.  No rib fracture is identified.  IMPRESSION: Negative exam.   Original Report Authenticated By: Holley Dexter, M.D.    Dg Shoulder Right  10/05/2012  *RADIOLOGY REPORT*  Clinical Data: Horse fell on patient.  RIGHT SHOULDER - 2+ VIEW  Comparison: 09/21/2007  Findings: Negative for fracture.  The alignment is normal.  Chronic changes of the greater   trochanter related to chronic rotator cuff tendinopathy.  Mild down sloping of the acromion.  IMPRESSION: Negative for fracture.   Original Report Authenticated By: Janeece Riggers, M.D.      1. Shoulder sprain, right, initial encounter   2. Chest wall injury, initial encounter    No acute bony injury.  She already has shoulder sling at home - encouraged to use and also range shoulder each day.  She was referred to orthopedics.     MDM  Nursing notes including past medical history and social history reviewed and considered in documentation xrays reviewed and considered         Joya Gaskins, MD 10/05/12 1536

## 2012-10-05 NOTE — ED Notes (Signed)
Pt states she had a 1800 lb horse fall onto her and she injured her right shoulder. Pt denies blackout. Reports numbness to right shoulder.

## 2013-04-13 ENCOUNTER — Emergency Department (HOSPITAL_COMMUNITY)
Admission: EM | Admit: 2013-04-13 | Discharge: 2013-04-13 | Disposition: A | Discharge status: Home / Self Care | Payer: PRIVATE HEALTH INSURANCE | Attending: Emergency Medicine | Admitting: Emergency Medicine

## 2013-04-13 ENCOUNTER — Encounter (HOSPITAL_COMMUNITY): Payer: Self-pay

## 2013-04-13 DIAGNOSIS — S39012A Strain of muscle, fascia and tendon of lower back, initial encounter: Secondary | ICD-10-CM

## 2013-04-13 DIAGNOSIS — Z880 Allergy status to penicillin: Secondary | ICD-10-CM | POA: Insufficient documentation

## 2013-04-13 DIAGNOSIS — Y929 Unspecified place or not applicable: Secondary | ICD-10-CM | POA: Insufficient documentation

## 2013-04-13 DIAGNOSIS — G40909 Epilepsy, unspecified, not intractable, without status epilepticus: Secondary | ICD-10-CM | POA: Insufficient documentation

## 2013-04-13 DIAGNOSIS — S335XXA Sprain of ligaments of lumbar spine, initial encounter: Secondary | ICD-10-CM | POA: Insufficient documentation

## 2013-04-13 DIAGNOSIS — F17200 Nicotine dependence, unspecified, uncomplicated: Secondary | ICD-10-CM | POA: Insufficient documentation

## 2013-04-13 DIAGNOSIS — Z79899 Other long term (current) drug therapy: Secondary | ICD-10-CM | POA: Insufficient documentation

## 2013-04-13 DIAGNOSIS — X503XXA Overexertion from repetitive movements, initial encounter: Secondary | ICD-10-CM | POA: Insufficient documentation

## 2013-04-13 DIAGNOSIS — Y9389 Activity, other specified: Secondary | ICD-10-CM | POA: Insufficient documentation

## 2013-04-13 MED ORDER — CYCLOBENZAPRINE HCL 10 MG PO TABS: 10.0000 mg | ORAL_TABLET | Freq: Three times a day (TID) | ORAL | Status: DC | PRN

## 2013-04-13 MED ORDER — OXYCODONE-ACETAMINOPHEN 5-325 MG PO TABS: 1.0000 | ORAL_TABLET | ORAL | Status: DC | PRN

## 2013-04-13 MED ORDER — NAPROXEN 500 MG PO TABS: 500.0000 mg | ORAL_TABLET | Freq: Two times a day (BID) | ORAL | Status: DC

## 2013-04-13 NOTE — ED Notes (Signed)
Pt reports back pain since Sunday, she was baling hay.

## 2013-04-13 NOTE — ED Notes (Signed)
Seen by me at time of d/c only, exam by PA

## 2013-04-13 NOTE — ED Provider Notes (Signed)
CSN: [id number]     Arrival date & time 04/13/13  1612 History   First MD Initiated Contact with Patient 04/13/13 1658     Chief Complaint  Patient presents with  . Back Pain   (Consider location/radiation/quality/duration/timing/severity/associated sxs/prior Treatment) Patient is a 47 y.o. female presenting with back pain. The history is provided by the patient.  Back Pain Location:  Lumbar spine Quality:  Aching Radiates to:  Does not radiate Pain severity:  Moderate Pain is:  Same all the time Onset quality:  Gradual Duration:  2 days Timing:  Constant Progression:  Unchanged Chronicity:  Recurrent Context: lifting heavy objects and twisting   Context comment:  Patint c/o low back pain after lifting heavy bales of hay and twisting to put them on a truck.   Relieved by:  Nothing Worsened by:  Bending, twisting and standing Ineffective treatments:  Lying down and OTC medications Associated symptoms: no abdominal pain, no abdominal swelling, no bladder incontinence, no bowel incontinence, no chest pain, no dysuria, no fever, no headaches, no leg pain, no numbness, no paresthesias, no pelvic pain, no perianal numbness, no tingling and no weakness     Past Medical History  Diagnosis Date  . Seizures   . Back problem    Past Surgical History  Procedure Laterality Date  . Knee surgery    . Kidney surgery    . Cholecystectomy     No family history on file. History  Substance Use Topics  . Smoking status: Current Every Day Smoker  . Smokeless tobacco: Not on file  . Alcohol Use: No   OB History   Grav Para Term Preterm Abortions TAB SAB Ect Mult Living                 Review of Systems  Constitutional: Negative for fever.  Respiratory: Negative for shortness of breath.   Cardiovascular: Negative for chest pain.  Gastrointestinal: Negative for vomiting, abdominal pain, constipation and bowel incontinence.  Genitourinary: Negative for bladder incontinence, dysuria,  hematuria, flank pain, decreased urine volume, difficulty urinating and pelvic pain.       No perineal numbness or incontinence of urine or feces  Musculoskeletal: Positive for back pain. Negative for joint swelling.  Skin: Negative for rash.  Neurological: Negative for tingling, weakness, numbness, headaches and paresthesias.  All other systems reviewed and are negative.    Allergies  Penicillins  Home Medications   Current Outpatient Rx  Name  Route  Sig  Dispense  Refill  . divalproex (DEPAKOTE) 500 MG DR tablet   Oral   Take 500 mg by mouth 3 (three) times daily.         . medroxyPROGESTERone (DEPO-PROVERA) 150 MG/ML injection   Intramuscular   Inject 150 mg into the muscle every 3 (three) months.         . Multiple Vitamin (MULTIVITAMIN WITH MINERALS) TABS   Oral   Take 1 tablet by mouth daily.         Marland Kitchen oxyCODONE-acetaminophen (PERCOCET/ROXICET) 5-325 MG per tablet   Oral   Take 1 tablet by mouth every 4 (four) hours as needed for pain.   15 tablet   0    BP 121/63  Pulse 88  Temp(Src) 98.3 F (36.8 C) (Oral)  Resp 20  SpO2 100% Physical Exam  Nursing note and vitals reviewed. Constitutional: She is oriented to person, place, and time. She appears well-developed and well-nourished. No distress.  HENT:  Head: Normocephalic and atraumatic.  Neck: Normal range of motion. Neck supple.  Cardiovascular: Normal rate, regular rhythm, normal heart sounds and intact distal pulses.   No murmur heard. Pulmonary/Chest: Effort normal and breath sounds normal. No respiratory distress.  Abdominal: Soft. She exhibits no distension. There is no tenderness.  Musculoskeletal: She exhibits tenderness. She exhibits no edema.       Lumbar back: She exhibits tenderness and pain. She exhibits normal range of motion, no swelling, no deformity, no laceration and normal pulse.  ttp of the lumbar spine and paraspinal muscles.   No CVA tenderness.  DP pulses are brisk and  symmetrical.  Distal sensation intact.  Hip Flexors/Extensors are intact  Neurological: She is alert and oriented to person, place, and time. She has normal strength. No sensory deficit. She exhibits normal muscle tone. Coordination and gait normal.  Reflex Scores:      Patellar reflexes are 2+ on the right side and 2+ on the left side.      Achilles reflexes are 2+ on the right side and 2+ on the left side. Skin: Skin is warm and dry. No rash noted.    ED Course  Procedures (including critical care time) Labs Review Labs Reviewed - No data to display Imaging Review No results found.  MDM    Patient has ttp of the lower lumbar spine and paraspinal muscles.  No focal neuro deficits on exam.  Ambulates with a steady gait.   No concerning sx's for emergent neurological or infectious process.  I have explained to the patient that given the nature of her injury, that a disk injury is possible and that she will need further follow-up with her PMD if the sx's are not improving   She verbalized understanding and agrees to the care plan.  Referral info given  VSS.  She appears stable for discharge.    Gayle Collard L. Trisha Mangle, PA-C 04/15/13 2029

## 2013-04-16 NOTE — ED Provider Notes (Signed)
Medical screening examination/treatment/procedure(s) were performed by non-physician practitioner and as supervising physician I was immediately available for consultation/collaboration.    Jniyah Dantuono D Jenne Sellinger, MD 04/16/13 0836 

## 2013-07-07 ENCOUNTER — Encounter (HOSPITAL_COMMUNITY): Payer: Self-pay | Admitting: Emergency Medicine

## 2013-07-07 ENCOUNTER — Emergency Department (HOSPITAL_COMMUNITY)
Admission: EM | Admit: 2013-07-07 | Discharge: 2013-07-07 | Disposition: A | Discharge status: Home / Self Care | Payer: PRIVATE HEALTH INSURANCE | Attending: Emergency Medicine | Admitting: Emergency Medicine

## 2013-07-07 DIAGNOSIS — F17200 Nicotine dependence, unspecified, uncomplicated: Secondary | ICD-10-CM | POA: Insufficient documentation

## 2013-07-07 DIAGNOSIS — Z79899 Other long term (current) drug therapy: Secondary | ICD-10-CM | POA: Insufficient documentation

## 2013-07-07 DIAGNOSIS — S46909A Unspecified injury of unspecified muscle, fascia and tendon at shoulder and upper arm level, unspecified arm, initial encounter: Secondary | ICD-10-CM | POA: Insufficient documentation

## 2013-07-07 DIAGNOSIS — S4980XA Other specified injuries of shoulder and upper arm, unspecified arm, initial encounter: Secondary | ICD-10-CM | POA: Insufficient documentation

## 2013-07-07 DIAGNOSIS — Z880 Allergy status to penicillin: Secondary | ICD-10-CM | POA: Insufficient documentation

## 2013-07-07 DIAGNOSIS — G40909 Epilepsy, unspecified, not intractable, without status epilepticus: Secondary | ICD-10-CM | POA: Insufficient documentation

## 2013-07-07 DIAGNOSIS — X500XXA Overexertion from strenuous movement or load, initial encounter: Secondary | ICD-10-CM | POA: Insufficient documentation

## 2013-07-07 DIAGNOSIS — Y9389 Activity, other specified: Secondary | ICD-10-CM | POA: Insufficient documentation

## 2013-07-07 DIAGNOSIS — Y929 Unspecified place or not applicable: Secondary | ICD-10-CM | POA: Insufficient documentation

## 2013-07-07 DIAGNOSIS — G8929 Other chronic pain: Secondary | ICD-10-CM | POA: Insufficient documentation

## 2013-07-07 DIAGNOSIS — S4991XA Unspecified injury of right shoulder and upper arm, initial encounter: Secondary | ICD-10-CM

## 2013-07-07 MED ORDER — CYCLOBENZAPRINE HCL 10 MG PO TABS: 10.0000 mg | ORAL_TABLET | Freq: Two times a day (BID) | ORAL | Status: DC | PRN

## 2013-07-07 MED ORDER — OXYCODONE-ACETAMINOPHEN 5-325 MG PO TABS
1.0000 | ORAL_TABLET | Freq: Once | ORAL | Status: AC
  Administered 2013-07-07: 1 via ORAL
  Filled 2013-07-07: qty 1

## 2013-07-07 MED ORDER — OXYCODONE-ACETAMINOPHEN 5-325 MG PO TABS: 1.0000 | ORAL_TABLET | Freq: Four times a day (QID) | ORAL | Status: DC | PRN

## 2013-07-07 NOTE — ED Notes (Signed)
Pt c/o numbness/tingling pain to right shoulder since moving hay bales on Sunday. Pt states she is scheduled for rotator cuff surgery on 12/18. Pt called surgeon and was instructed to come to ED.

## 2013-07-07 NOTE — ED Provider Notes (Signed)
CSN: [id number]     Arrival date & time 07/07/13  6295 History  This chart was scribed for Shelda Jakes, MD by Quintella Reichert, ED scribe.  This patient was seen in room APA06/APA06 and the patient's care was started at 7:19 AM.   Chief Complaint  Patient presents with  . Shoulder Pain    Patient is a 47 y.o. female presenting with shoulder pain. The history is provided by the patient. No language interpreter was used.  Shoulder Pain This is a chronic problem. The current episode started more than 2 days ago. The problem occurs constantly. The problem has not changed since onset.Pertinent negatives include no chest pain, no abdominal pain, no headaches and no shortness of breath. Exacerbated by: movement. The symptoms are relieved by rest. She has tried nothing for the symptoms.    HPI Comments: Meeya Klenke is a 47 y.o. female who presents to the Emergency Department complaining of 3 days exacerbation of her chronic right shoulder pain.  Pt states she has had issues with both shoulder for the past 2 years and is scheduled to have surgery on 12/18.  She is not certain what type of shoulder injury she has been diagnosed with or what type of surgery she is scheduled to receive.  Her current exacerbation of the pain in her right shoulder began after she was manually lifting 50-pound hay bales into a barn.  Currently she complains of throbbing 10/10 pain worsened by movement that is similar to her chronic pain.  She states she has had difficulty sleeping and working due to her pain.  She has not attempted to treat pain pta.  In the past she has had relief from Flexeril and Percocet.  She has used a sling in the past but states she cannot find it.  Pt has no PCP Orthopedist is Dr. Thurston Hole in [place]   Past Medical History  Diagnosis Date  . Seizures   . Back problem     Past Surgical History  Procedure Laterality Date  . Knee surgery    . Kidney surgery    . Cholecystectomy       History reviewed. No pertinent family history.   History  Substance Use Topics  . Smoking status: Current Every Day Smoker  . Smokeless tobacco: Not on file  . Alcohol Use: No    OB History   Grav Para Term Preterm Abortions TAB SAB Ect Mult Living                  Review of Systems  Constitutional: Negative for fever and chills.  HENT: Negative for congestion, rhinorrhea and sore throat.   Eyes: Negative for visual disturbance.  Respiratory: Negative for cough and shortness of breath.   Cardiovascular: Negative for chest pain and leg swelling.  Gastrointestinal: Negative for abdominal pain.  Genitourinary: Negative for dysuria.  Musculoskeletal: Positive for arthralgias. Negative for back pain and neck pain.  Skin: Negative for rash.  Neurological: Negative for headaches.  Hematological: Does not bruise/bleed easily.  Psychiatric/Behavioral: Negative for confusion.     Allergies  Gabapentin; Naproxen; Penicillins; and Vicodin  Home Medications   Current Outpatient Rx  Name  Route  Sig  Dispense  Refill  . cyclobenzaprine (FLEXERIL) 10 MG tablet   Oral   Take 1 tablet (10 mg total) by mouth 2 (two) times daily as needed for muscle spasms.   20 tablet   0   . cyclobenzaprine (FLEXERIL) 10 MG tablet  Oral   Take 1 tablet (10 mg total) by mouth 2 (two) times daily as needed for muscle spasms.   20 tablet   0   . divalproex (DEPAKOTE) 500 MG DR tablet   Oral   Take 500 mg by mouth 3 (three) times daily.         . medroxyPROGESTERone (DEPO-PROVERA) 150 MG/ML injection   Intramuscular   Inject 150 mg into the muscle every 3 (three) months.         . Multiple Vitamin (MULTIVITAMIN WITH MINERALS) TABS   Oral   Take 1 tablet by mouth daily.         . naproxen (NAPROSYN) 500 MG tablet   Oral   Take 1 tablet (500 mg total) by mouth 2 (two) times daily. Take with food   20 tablet   0   . oxyCODONE-acetaminophen (PERCOCET/ROXICET) 5-325 MG per  tablet   Oral   Take 1 tablet by mouth every 4 (four) hours as needed for pain.   15 tablet   0   . oxyCODONE-acetaminophen (PERCOCET/ROXICET) 5-325 MG per tablet   Oral   Take 1 tablet by mouth every 4 (four) hours as needed for pain.   15 tablet   0   . oxyCODONE-acetaminophen (PERCOCET/ROXICET) 5-325 MG per tablet   Oral   Take 1-2 tablets by mouth every 6 (six) hours as needed for severe pain.   20 tablet   0    BP 139/89  Pulse 100  Temp(Src) 98.3 F (36.8 C) (Oral)  Resp 17  Ht 5\' 7"  (1.702 m)  Wt 160 lb (72.576 kg)  BMI 25.05 kg/m2  SpO2 98%  Physical Exam  Nursing note and vitals reviewed. Constitutional: She is oriented to person, place, and time. She appears well-developed and well-nourished. No distress.  HENT:  Head: Normocephalic and atraumatic.  Eyes: EOM are normal.  Neck: Neck supple. No tracheal deviation present.  Cardiovascular: Normal rate, regular rhythm and normal heart sounds.   No murmur heard. Pulses:      Radial pulses are 2+ on the right side.  Pulmonary/Chest: Effort normal and breath sounds normal. No respiratory distress. She has no wheezes. She has no rales.  Abdominal: Soft. Bowel sounds are normal. There is no tenderness.  Musculoskeletal: Normal range of motion. She exhibits no edema.       Right shoulder: She exhibits tenderness (anterior right shoulder). She exhibits no deformity.  Neurological: She is alert and oriented to person, place, and time.  Skin: Skin is warm and dry.  Psychiatric: She has a normal mood and affect. Her behavior is normal.    ED Course  Procedures (including critical care time)  DIAGNOSTIC STUDIES: Oxygen Saturation is 98% on room air, normal by my interpretation.    COORDINATION OF CARE: 7:27 AM-Discussed treatment plan which includes shoulder immobilization, pain medication and muscle relaxants, and orthopedic f/u with pt at bedside and pt agreed to plan.    Labs Review Labs Reviewed - No data  to display  Imaging Review No results found.  EKG Interpretation   None       MDM   1. Shoulder injury, right, initial encounter    Patient with long-standing problem with the shoulder no evidence clinically of dislocation or fracture. This is a chronic shoulder problem patient is followed by orthopedics. Will be treated symptomatically. X-rays not required. Patient provided a shoulder immobilizer but noted that when she left she choose not to use it. Patient  has been seen frequently for pain related problems some concern being raised with patient using ED for treatment of her chronic pain. This was discussed with the patient.   I personally performed the services described in this documentation, which was scribed in my presence. The recorded information has been reviewed and is accurate.     Shelda Jakes, MD 07/10/13 [id number]

## 2013-08-16 ENCOUNTER — Other Ambulatory Visit: Payer: Self-pay | Admitting: Family Medicine

## 2013-08-16 ENCOUNTER — Other Ambulatory Visit: Payer: Self-pay

## 2013-08-16 ENCOUNTER — Ambulatory Visit
Admission: RE | Admit: 2013-08-16 | Discharge: 2013-08-16 | Disposition: A | Discharge status: Home / Self Care | Payer: PRIVATE HEALTH INSURANCE | Source: Ambulatory Visit

## 2013-08-16 ENCOUNTER — Ambulatory Visit
Admission: RE | Admit: 2013-08-16 | Discharge: 2013-08-16 | Disposition: A | Discharge status: Home / Self Care | Payer: PRIVATE HEALTH INSURANCE | Source: Ambulatory Visit | Attending: Family Medicine | Admitting: Family Medicine

## 2013-08-16 DIAGNOSIS — Z1231 Encounter for screening mammogram for malignant neoplasm of breast: Secondary | ICD-10-CM

## 2013-08-16 DIAGNOSIS — M25511 Pain in right shoulder: Secondary | ICD-10-CM

## 2013-09-03 ENCOUNTER — Encounter (INDEPENDENT_AMBULATORY_CARE_PROVIDER_SITE_OTHER): Payer: Self-pay

## 2013-09-03 ENCOUNTER — Encounter: Payer: Self-pay | Admitting: Family Medicine

## 2013-09-03 ENCOUNTER — Ambulatory Visit (INDEPENDENT_AMBULATORY_CARE_PROVIDER_SITE_OTHER): Payer: PRIVATE HEALTH INSURANCE

## 2013-09-03 ENCOUNTER — Ambulatory Visit (INDEPENDENT_AMBULATORY_CARE_PROVIDER_SITE_OTHER): Payer: PRIVATE HEALTH INSURANCE | Admitting: Family Medicine

## 2013-09-03 VITALS — BP 107/76 | HR 100 | Temp 97.5°F | Ht 66.5 in | Wt 169.0 lb

## 2013-09-03 DIAGNOSIS — M25519 Pain in unspecified shoulder: Secondary | ICD-10-CM

## 2013-09-03 DIAGNOSIS — M25512 Pain in left shoulder: Secondary | ICD-10-CM

## 2013-09-03 MED ORDER — METHOCARBAMOL 500 MG PO TABS: 500.0000 mg | ORAL_TABLET | Freq: Four times a day (QID) | ORAL | Status: DC
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
## 2013-09-03 NOTE — Patient Instructions (Signed)
   Shoulder Pain The shoulder is the joint that connects your arms to your body. The bones that form the shoulder joint include the upper arm bone (humerus), the shoulder blade (scapula), and the collarbone (clavicle). The top of the humerus is shaped like a ball and fits into a rather flat socket on the scapula (glenoid cavity). A combination of muscles and strong, fibrous tissues that connect muscles to bones (tendons) support your shoulder joint and hold the ball in the socket. Small, fluid-filled sacs (bursae) are located in different areas of the joint. They act as cushions between the bones and the overlying soft tissues and help reduce friction between the gliding tendons and the bone as you move your arm. Your shoulder joint allows a wide range of motion in your arm. This range of motion allows you to do things like scratch your back or throw a ball. However, this range of motion also makes your shoulder more prone to pain from overuse and injury. Causes of shoulder pain can originate from both injury and overuse and usually can be grouped in the following four categories:  Redness, swelling, and pain (inflammation) of the tendon (tendinitis) or the bursae (bursitis).  Instability, such as a dislocation of the joint.  Inflammation of the joint (arthritis).  Broken bone (fracture). HOME CARE INSTRUCTIONS   Apply ice to the sore area.  Put ice in a plastic bag.  Place a towel between your skin and the bag.  Leave the ice on for 15-20 minutes, 03-04 times per day for the first 2 days.  Stop using cold packs if they do not help with the pain.  If you have a shoulder sling or immobilizer, wear it as long as your caregiver instructs. Only remove it to shower or bathe. Move your arm as little as possible, but keep your hand moving to prevent swelling.  Squeeze a soft ball or foam pad as much as possible to help prevent swelling.  Only take over-the-counter or prescription medicines for  pain, discomfort, or fever as directed by your caregiver. SEEK MEDICAL CARE IF:   Your shoulder pain increases, or new pain develops in your arm, hand, or fingers.  Your hand or fingers become cold and numb.  Your pain is not relieved with medicines. SEEK IMMEDIATE MEDICAL CARE IF:   Your arm, hand, or fingers are numb or tingling.  Your arm, hand, or fingers are significantly swollen or turn white or blue. MAKE SURE YOU:   Understand these instructions.  Will watch your condition.  Will get help right away if you are not doing well or get worse. Document Released: 05/01/2005 Document Revised: 04/15/2012 Document Reviewed: 07/06/2011 Exit[employer] Patient Information 2014 [employer].  

## 2013-09-07 ENCOUNTER — Telehealth: Payer: Self-pay | Admitting: Family Medicine

## 2013-09-07 NOTE — Telephone Encounter (Signed)
Message copied by Waverly Ferrari on Tue Sep 07, 2013 11:36 AM ------      Message from: Lysbeth Penner      Created: Mon Sep 06, 2013  1:39 PM       Xray showing tendonitis and follow up if not better ------

## 2013-09-08 ENCOUNTER — Other Ambulatory Visit: Payer: Self-pay | Admitting: Family Medicine

## 2013-09-08 DIAGNOSIS — M25519 Pain in unspecified shoulder: Secondary | ICD-10-CM

## 2013-10-21 ENCOUNTER — Emergency Department (HOSPITAL_COMMUNITY): Payer: PRIVATE HEALTH INSURANCE

## 2013-10-21 ENCOUNTER — Emergency Department (HOSPITAL_COMMUNITY)
Admission: EM | Admit: 2013-10-21 | Discharge: 2013-10-21 | Discharge status: Against Medical Advice | Payer: PRIVATE HEALTH INSURANCE | Attending: Emergency Medicine | Admitting: Emergency Medicine

## 2013-10-21 ENCOUNTER — Encounter (HOSPITAL_COMMUNITY): Payer: Self-pay | Admitting: Emergency Medicine

## 2013-10-21 DIAGNOSIS — R079 Chest pain, unspecified: Secondary | ICD-10-CM | POA: Insufficient documentation

## 2013-10-21 DIAGNOSIS — Z880 Allergy status to penicillin: Secondary | ICD-10-CM | POA: Insufficient documentation

## 2013-10-21 DIAGNOSIS — Z8669 Personal history of other diseases of the nervous system and sense organs: Secondary | ICD-10-CM | POA: Insufficient documentation

## 2013-10-21 DIAGNOSIS — F17200 Nicotine dependence, unspecified, uncomplicated: Secondary | ICD-10-CM | POA: Insufficient documentation

## 2013-10-21 DIAGNOSIS — R209 Unspecified disturbances of skin sensation: Secondary | ICD-10-CM | POA: Insufficient documentation

## 2013-10-21 LAB — CBC WITH DIFFERENTIAL/PLATELET
BASOS ABS: 0 10*3/uL (ref 0.0–0.1)
Basophils Relative: 1 % (ref 0–1)
Eosinophils Absolute: 0.1 10*3/uL (ref 0.0–0.7)
Eosinophils Relative: 2 % (ref 0–5)
HCT: 35.1 % — ABNORMAL LOW (ref 36.0–46.0)
Hemoglobin: 11.5 g/dL — ABNORMAL LOW (ref 12.0–15.0)
LYMPHS PCT: 32 % (ref 12–46)
Lymphs Abs: 1.8 10*3/uL (ref 0.7–4.0)
MCH: 27.2 pg (ref 26.0–34.0)
MCHC: 32.8 g/dL (ref 30.0–36.0)
MCV: 83 fL (ref 78.0–100.0)
Monocytes Absolute: 0.4 10*3/uL (ref 0.1–1.0)
Monocytes Relative: 6 % (ref 3–12)
NEUTROS ABS: 3.4 10*3/uL (ref 1.7–7.7)
NEUTROS PCT: 59 % (ref 43–77)
Platelets: 309 10*3/uL (ref 150–400)
RBC: 4.23 MIL/uL (ref 3.87–5.11)
RDW: 15.1 % (ref 11.5–15.5)
WBC: 5.7 10*3/uL (ref 4.0–10.5)

## 2013-10-21 LAB — BASIC METABOLIC PANEL
BUN: 7 mg/dL (ref 6–23)
CHLORIDE: 102 meq/L (ref 96–112)
CO2: 29 mEq/L (ref 19–32)
CREATININE: 0.75 mg/dL (ref 0.50–1.10)
Calcium: 9.5 mg/dL (ref 8.4–10.5)
Glucose, Bld: 99 mg/dL (ref 70–99)
Potassium: 4.4 mEq/L (ref 3.7–5.3)
Sodium: 140 mEq/L (ref 137–147)

## 2013-10-21 LAB — D-DIMER, QUANTITATIVE: D-Dimer, Quant: 0.35 ug/mL-FEU (ref 0.00–0.48)

## 2013-10-21 LAB — TROPONIN I: Troponin I: <0.3 ng/mL (ref <0.30)

## 2013-10-21 MED ORDER — GI COCKTAIL ~~LOC~~
30.0000 mL | Freq: Once | ORAL | Status: AC
  Administered 2013-10-21: 30 mL via ORAL
  Filled 2013-10-21: qty 30
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
## 2013-10-21 NOTE — ED Notes (Signed)
Prior treatment by EMS, ASA 324 mg and NTG x 2

## 2013-10-21 NOTE — ED Provider Notes (Signed)
CSN: [id number]     Arrival date & time 10/21/13  4098 History  This chart was scribed for Ezequiel Essex, MD by Allena Earing, ED Scribe. This patient was seen in room APA19/APA19 and the patient's care was started at 8: 24 AM .    Chief Complaint  Patient presents with  . Chest Pain     The history is provided by the patient. No language interpreter was used.    HPI Comments: Janet Cohen is a 48 y.o. 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 Pt states that the CP began in the shower and felt like her chest "was caving in". Pt reports that the pain is in the center of her chest and had associated numbness down her right arm. Pt reports associated SOB that has since resolved somewhat. Pt reports dizziness that began after taking nitro, nitro provided relief. Pt denies vomiting or pain radiation to back or neck. Pt smokes. Pt reports no familial h/o heart problems or diabetes.   PCP Dr. Lin Landsman   Past Medical History  Diagnosis Date  . Seizures   . Back problem    History reviewed. No pertinent past surgical history. Family History  Problem Relation Age of Onset  . Mesothelioma Father    History  Substance Use Topics  . Smoking status: Current Every Day Smoker -- 0.50 packs/day    Types: Cigarettes    Start date: 09/04/2007  . Smokeless tobacco: Not on file  . Alcohol Use: No   OB History   Grav Para Term Preterm Abortions TAB SAB Ect Mult Living                 Review of Systems  Respiratory: Positive for shortness of breath.   Cardiovascular: Positive for chest pain (center chest).  Gastrointestinal: Negative for vomiting.  Neurological: Positive for numbness (right arm).    A complete 10 system review of systems was obtained and all systems are negative except as noted in the HPI and PMH.       Allergies  Tramadol; Gabapentin; Naproxen; Penicillins; Vicodin; and Toradol  Home Medications   Current Outpatient Rx  Name  Route  Sig  Dispense  Refill  . medroxyPROGESTERone (DEPO-PROVERA) 150 MG/ML injection   Intramuscular   Inject 150 mg into the muscle every 3 (three) months.         Marland Kitchen oxyCODONE-acetaminophen (PERCOCET) 10-325 MG per tablet   Oral   Take 1 tablet by mouth every 4 (four) hours as needed for pain.         Marland Kitchen zolpidem (AMBIEN) 10 MG tablet   Oral   Take 10 mg by mouth at bedtime as needed for sleep.          BP 110/81  Pulse 101  Temp(Src) 98.4 F (36.9 C) (Oral)  Resp 16  Ht 5\' 7"  (1.702 m)  Wt 180 lb (81.647 kg)  BMI 28.19 kg/m2  SpO2 97% Physical Exam  Nursing note and vitals reviewed. Constitutional: She is oriented to person, place, and time. She appears well-developed and well-nourished. She is active.  Non-toxic appearance.  HENT:  Head: Normocephalic and atraumatic.  Eyes: Pupils are equal, round, and reactive to light.  Neck: Normal range of motion.  Cardiovascular: Regular rhythm, normal heart sounds and intact distal pulses.  Tachycardia  present.   No murmur heard. Pulmonary/Chest: Effort normal and breath sounds normal.  Abdominal: Soft. Normal appearance.  Musculoskeletal: Normal range of motion.  Neurological: She is alert and oriented to person, place, and time. She has normal reflexes.  CN 2-12 intact, no ataxia on finger to nose, no nystagmus, 5/5 strength throughout, no pronator drift, Romberg negative, normal gait.   Skin: Skin is warm.    ED Course  Procedures (including critical care time)  DIAGNOSTIC STUDIES: Oxygen Saturation is 97% on RA, normal by my interpretation.    COORDINATION OF CARE:  8:28 AM-Discussed treatment plan which includes labs, CXR, and an EKG with pt at bedside and pt agreed to plan.      Labs Review Labs Reviewed  CBC WITH DIFFERENTIAL - Abnormal; Notable for the following:     Hemoglobin 11.5 (*)    HCT 35.1 (*)    All other components within normal limits  BASIC METABOLIC PANEL  TROPONIN I  D-DIMER, QUANTITATIVE   Imaging Review Dg Chest Portable 1 View  10/21/2013   CLINICAL DATA:  Chest pain, history of tobacco use.  EXAM: PORTABLE CHEST - 1 VIEW  COMPARISON:  DG RIBS UNILATERAL W/CHEST*R* dated 10/05/2012  FINDINGS: The lungs are less well inflated today. There is no focal infiltrate. There is no pleural effusion or pneumothorax. The cardiopericardial silhouette is normal in size. The pulmonary vascularity is not engorged. The mediastinum is normal in width. The observed portions of the bony thorax exhibit no acute abnormalities.  IMPRESSION: There is no evidence of active cardiopulmonary disease.   Electronically Signed   By: David  Martinique   On: 10/21/2013 08:43     EKG Interpretation None      MDM   Final diagnoses:  Chest pain   episode of central chest pain that onset around 6 AM after lifting some bags of food. Radiates to right arm. Pain has been constant for the past 2 hours but waxing and waning in severity. No previous cardiac history. No previous stress test.  EKG with stable nonspecific ST changes. Troponin negative. D-dimer checked with tachycardia. This was negative.  Patient reports resolution of her pain in the ED, after ASA, NTG, GI cocktail . She has some typical and atypical fractures the pain. The pain did come on after she lifted some heavy bags of food. However the pain is right-sided and ongoing for the past 2 hours. HEART score 3.   Observation admission recommended for patient. She declines stating she has to go back to work. She understands that she may be at risk for having a heart attack and possibly death. She has capacity to make this decision will leave against medical advice.   I personally performed the services described in this documentation, which was scribed in my presence. The recorded information has been reviewed  and is accurate.    Date: 10/21/2013  Rate: 100  Rhythm: sinus tachycardia  QRS Axis: normal  Intervals: normal  ST/T Wave abnormalities: nonspecific ST/T changes  Conduction Disutrbances:none  Narrative Interpretation:   Old EKG Reviewed: unchanged    Ezequiel Essex, MD 10/21/13 1511

## 2013-10-21 NOTE — ED Notes (Signed)
Pt co rt sided chest pain that started this morning at 6 am with numbness to rt arm.

## 2014-04-14 ENCOUNTER — Observation Stay (HOSPITAL_COMMUNITY)
Admission: EM | Admit: 2014-04-14 | Discharge: 2014-04-16 | Disposition: A | Discharge status: Home / Self Care | Payer: PRIVATE HEALTH INSURANCE | Attending: Internal Medicine | Admitting: Internal Medicine

## 2014-04-14 ENCOUNTER — Encounter (HOSPITAL_COMMUNITY): Payer: Self-pay | Admitting: Emergency Medicine

## 2014-04-14 ENCOUNTER — Emergency Department (HOSPITAL_COMMUNITY): Payer: PRIVATE HEALTH INSURANCE

## 2014-04-14 DIAGNOSIS — Z880 Allergy status to penicillin: Secondary | ICD-10-CM | POA: Diagnosis not present

## 2014-04-14 DIAGNOSIS — Z23 Encounter for immunization: Secondary | ICD-10-CM | POA: Insufficient documentation

## 2014-04-14 DIAGNOSIS — F17200 Nicotine dependence, unspecified, uncomplicated: Secondary | ICD-10-CM | POA: Diagnosis not present

## 2014-04-14 DIAGNOSIS — R079 Chest pain, unspecified: Secondary | ICD-10-CM | POA: Diagnosis not present

## 2014-04-14 DIAGNOSIS — Z79899 Other long term (current) drug therapy: Secondary | ICD-10-CM

## 2014-04-14 DIAGNOSIS — R569 Unspecified convulsions: Secondary | ICD-10-CM | POA: Diagnosis not present

## 2014-04-14 DIAGNOSIS — E876 Hypokalemia: Secondary | ICD-10-CM | POA: Diagnosis not present

## 2014-04-14 DIAGNOSIS — G8929 Other chronic pain: Secondary | ICD-10-CM | POA: Insufficient documentation

## 2014-04-14 DIAGNOSIS — K219 Gastro-esophageal reflux disease without esophagitis: Secondary | ICD-10-CM

## 2014-04-14 DIAGNOSIS — M7542 Impingement syndrome of left shoulder: Secondary | ICD-10-CM

## 2014-04-14 DIAGNOSIS — G40909 Epilepsy, unspecified, not intractable, without status epilepticus: Secondary | ICD-10-CM | POA: Diagnosis present

## 2014-04-14 DIAGNOSIS — R7309 Other abnormal glucose: Secondary | ICD-10-CM

## 2014-04-14 HISTORY — DX: Other chronic pain: G89.29

## 2014-04-14 HISTORY — DX: Pain in unspecified shoulder: M25.519

## 2014-04-14 LAB — COMPREHENSIVE METABOLIC PANEL
ALK PHOS: 110 U/L (ref 39–117)
ALT: 17 U/L (ref 0–35)
AST: 24 U/L (ref 0–37)
Albumin: 3.7 g/dL (ref 3.5–5.2)
Anion gap: 13 (ref 5–15)
BUN: 4 mg/dL — ABNORMAL LOW (ref 6–23)
CO2: 25 meq/L (ref 19–32)
Calcium: 8.8 mg/dL (ref 8.4–10.5)
Chloride: 102 mEq/L (ref 96–112)
Creatinine, Ser: 0.78 mg/dL (ref 0.50–1.10)
GFR calc Af Amer: >90 mL/min (ref >90)
GFR calc non Af Amer: >90 mL/min (ref >90)
Glucose, Bld: 111 mg/dL — ABNORMAL HIGH (ref 70–99)
Potassium: 3 mEq/L — ABNORMAL LOW (ref 3.7–5.3)
SODIUM: 140 meq/L (ref 137–147)
TOTAL PROTEIN: 7.1 g/dL (ref 6.0–8.3)
Total Bilirubin: 0.6 mg/dL (ref 0.3–1.2)

## 2014-04-14 LAB — CBC WITH DIFFERENTIAL/PLATELET
BASOS ABS: 0 10*3/uL (ref 0.0–0.1)
Basophils Relative: 0 % (ref 0–1)
EOS ABS: 0.2 10*3/uL (ref 0.0–0.7)
Eosinophils Relative: 2 % (ref 0–5)
HCT: 35.4 % — ABNORMAL LOW (ref 36.0–46.0)
Hemoglobin: 11.8 g/dL — ABNORMAL LOW (ref 12.0–15.0)
LYMPHS ABS: 3.6 10*3/uL (ref 0.7–4.0)
Lymphocytes Relative: 49 % — ABNORMAL HIGH (ref 12–46)
MCH: 26.5 pg (ref 26.0–34.0)
MCHC: 33.3 g/dL (ref 30.0–36.0)
MCV: 79.6 fL (ref 78.0–100.0)
Monocytes Absolute: 0.3 10*3/uL (ref 0.1–1.0)
Monocytes Relative: 4 % (ref 3–12)
Neutro Abs: 3.4 10*3/uL (ref 1.7–7.7)
Neutrophils Relative %: 45 % (ref 43–77)
PLATELETS: 359 10*3/uL (ref 150–400)
RBC: 4.45 MIL/uL (ref 3.87–5.11)
RDW: 16 % — ABNORMAL HIGH (ref 11.5–15.5)
WBC: 7.5 10*3/uL (ref 4.0–10.5)

## 2014-04-14 LAB — VALPROIC ACID LEVEL

## 2014-04-14 LAB — TROPONIN I: Troponin I: <0.3 ng/mL (ref <0.30)

## 2014-04-14 MED ORDER — ASPIRIN 81 MG PO CHEW
324.0000 mg | CHEWABLE_TABLET | Freq: Once | ORAL | Status: AC
  Administered 2014-04-14: 324 mg via ORAL
  Filled 2014-04-14: qty 4

## 2014-04-14 MED ORDER — POTASSIUM CHLORIDE 20 MEQ/15ML (10%) PO LIQD
40.0000 meq | Freq: Once | ORAL | Status: AC
  Administered 2014-04-14: 40 meq via ORAL
  Filled 2014-04-14: qty 30

## 2014-04-14 MED ORDER — NITROGLYCERIN 0.4 MG SL SUBL
0.4000 mg | SUBLINGUAL_TABLET | SUBLINGUAL | Status: DC | PRN
  Administered 2014-04-14 – 2014-04-16 (×2): 0.4 mg via SUBLINGUAL
  Filled 2014-04-14 (×2): qty 1

## 2014-04-14 MED ORDER — ENOXAPARIN SODIUM 40 MG/0.4ML ~~LOC~~ SOLN: 40.0000 mg | SUBCUTANEOUS | Status: DC

## 2014-04-14 MED ORDER — POTASSIUM CHLORIDE CRYS ER 20 MEQ PO TBCR
40.0000 meq | EXTENDED_RELEASE_TABLET | Freq: Once | ORAL | Status: AC
  Administered 2014-04-15: 40 meq via ORAL
  Filled 2014-04-14: qty 2

## 2014-04-14 MED ORDER — VALPROATE SODIUM 500 MG/5ML IV SOLN
INTRAVENOUS | Status: AC
Start: 2014-04-14 — End: 2014-04-14
  Filled 2014-04-14: qty 10

## 2014-04-14 MED ORDER — VALPROATE SODIUM 500 MG/5ML IV SOLN
1000.0000 mg | Freq: Once | INTRAVENOUS | Status: AC
  Administered 2014-04-15: 1000 mg via INTRAVENOUS
  Filled 2014-04-14: qty 10

## 2014-04-14 MED ORDER — SODIUM CHLORIDE 0.9 % IV BOLUS (SEPSIS)
1000.0000 mL | Freq: Once | INTRAVENOUS | Status: AC
  Administered 2014-04-14: 1000 mL via INTRAVENOUS

## 2014-04-14 MED ORDER — ONDANSETRON HCL 4 MG/2ML IJ SOLN
4.0000 mg | Freq: Four times a day (QID) | INTRAMUSCULAR | Status: DC | PRN
  Administered 2014-04-16: 4 mg via INTRAVENOUS
  Filled 2014-04-14: qty 2

## 2014-04-14 MED ORDER — DIVALPROEX SODIUM ER 500 MG PO TB24
500.0000 mg | ORAL_TABLET | Freq: Every day | ORAL | Status: DC
  Administered 2014-04-15 – 2014-04-16 (×2): 500 mg via ORAL
  Filled 2014-04-14 (×2): qty 1

## 2014-04-14 MED ORDER — SODIUM CHLORIDE 0.9 % IV SOLN: INTRAVENOUS | Status: DC
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
## 2014-04-14 NOTE — H&P (Signed)
History and Physical    Janet Cohen QIH:[id number] DOB: August 12, 1965 DOA: 04/14/2014  Referring physician: Dr. Thurnell Garbe PCP: Redge Gainer, MD  Specialists: none  Chief Complaint: chest pain, seizure  HPI: Janet Cohen is a 48 y.o. female has a past medical history significant for seizure disorder, chronic back pain presents to the ED with chief complaint of chest pain that started today ~3 pm. Her chest pain was feeling as pressure like with irradiation into left arm and she endorses finger numbness with it. She endorses a history of recurrent chest pain associated with stress but also with exertion. 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 She usually has to stop and rest and in ~30 minutes her chest pressure improves. She was here with similar complaints in March of this year but left AMA. In the ED patient was given NTG and aspirin with resolution of her pain. She denies fever/chills, denies GI or GU symptoms.   Endorses a seizure today that lasted "maybe 5 seconds", not witnessed but no apparent post ictal state. She is supposed to be on Depakote 500 mg daily but hasn't been taking it in months because she was too busy working her job and horse farm.  Patient also shares with me that for years she hasn't consumed any food. She tells me that she drinks ~20 bottles of regular Dr. Malachi Bonds per day and every time she tries any solid foods her stomach "is pulsating" and she has nausea and vomiting so she gave up eating food.  Review of Systems: as per HPI otherwise negative  Past Medical History  Diagnosis Date  . Seizures   . Back problem   . Chronic shoulder pain    History reviewed. No pertinent past surgical history. Social History:  reports that she has been smoking Cigarettes.  She started smoking about 6  years ago. She has been smoking about 0.50 packs per day. She does not have any smokeless tobacco history on file. She reports that she does not drink alcohol or use illicit drugs.  Allergies  Allergen Reactions  . Tramadol Hives  . Gabapentin Hives  . Naproxen Hives  . Penicillins Swelling  . Vicodin [Hydrocodone-Acetaminophen] Itching  . Toradol [Ketorolac Tromethamine] Other (See Comments)    Syncope per pt    Family History  Problem Relation Age of Onset  . Mesothelioma Father    Prior to Admission medications   Medication Sig Start Date End Date Taking? Authorizing Provider  doxepin (SINEQUAN) 25 MG capsule Take 25 mg by mouth at bedtime as needed (for sleep).   Yes Historical Provider, MD  oxyCODONE-acetaminophen (PERCOCET) 10-325 MG per tablet Take 1-2 tablets by mouth every 4 (four) hours as needed for pain.    Yes Historical Provider, MD  zolpidem (AMBIEN) 10 MG tablet Take 10 mg by mouth at bedtime as needed for sleep.   Yes Historical Provider, MD   Physical Exam: Filed Vitals:   04/14/14 1959 04/14/14 2057  BP: 109/68   Pulse: 66   Temp: 98.2 F (36.8 C)   TempSrc: Oral   Resp: 20   SpO2: 100% 100%     General:  NAD  Neck: supple, no JVD  Cardiovascular: regular rate without MRG; 2+ peripheral pulses  Respiratory: CTA biL, good air movement without  wheezing, rhonchi or crackled  Abdomen: soft, non tender to palpation, positive bowel sounds, no guarding, no rebound  Skin: no rashes  Musculoskeletal: no peripheral edema  Neurologic: non focal  Labs on Admission:  Basic Metabolic Panel:  Recent Labs Lab 04/14/14 2035  NA 140  K 3.0*  CL 102  CO2 25  GLUCOSE 111*  BUN 4*  CREATININE 0.78  CALCIUM 8.8   Liver Function Tests:  Recent Labs Lab 04/14/14 2035  AST 24  ALT 17  ALKPHOS 110  BILITOT 0.6  PROT 7.1  ALBUMIN 3.7   CBC:  Recent Labs Lab 04/14/14 2035  WBC 7.5  NEUTROABS 3.4  HGB 11.8*  HCT 35.4*  MCV 79.6  PLT 359     Cardiac Enzymes:  Recent Labs Lab 04/14/14 2035  TROPONINI <0.30   Radiological Exams on Admission: Dg Chest 2 View  04/14/2014   CLINICAL DATA:  Left chest pain  EXAM: CHEST  2 VIEW  COMPARISON:  10/21/13  FINDINGS: The heart size and mediastinal contours are within normal limits. Both lungs are clear. The visualized skeletal structures are unremarkable.  IMPRESSION: No active cardiopulmonary disease.   Electronically Signed   By: Earle Gell M.D.   On: 04/14/2014 20:24    EKG: Independently reviewed. Non specific flattened T waves throughout.  Assessment/Plan Active Problems:   Chest pain   Seizure disorder   #1 Chest pain - patient's heart score is 3, will check CEs x 3 overnight and monitor on telemetry. She describes an exertional component to her chest pain and has been in the ED prior for this. She describes DOE as well and she has to limit her activities due to shortness of breath.  - obtain a 2D echo - will probably warrant a stress test, if CEs and 2D echo are normal perhaps this can be done as outpatient.   #2 Seizure disorder - her spell lasting "5 seconds of shaking and being on the floor" is not very typical; she has been off of her Depakote for several months, restart.  Diet: heart Fluids: none  DVT Prophylaxis: Lovenox  Code Status: Presumed Full  Family Communication: none  Disposition Plan: admit observation telemetry   Time spent: 50  This note has been created with Surveyor, quantity. Any transcriptional errors are unintentional.   Costin M. Cruzita Lederer, MD [facility] Pager [phone]  If 7PM-7AM, please contact night-coverage [email or web address] Password [facility] 04/14/2014, 11:34 PM

## 2014-04-14 NOTE — ED Provider Notes (Signed)
CSN: [id number]     Arrival date & time 04/14/14  1935 History   First MD Initiated Contact with Patient 04/14/14 2131     Chief Complaint  Patient presents with  . Chest Pain      HPI Pt was seen at 2150. Per pt, c/o gradual onset and persistence of waxing and waning left sided chest "pain" that began today approximately 1500. Pt describes the CP as "heavy" and "pressure," with radiation into her left shoulder and arm. States her "fingers get numb" when the CP worsens. CP worsens with exertion, improves with rest and is associated with SOB.  Pt also c/o sudden onset and resolution of one episode of seizure that occurred this afternoon while at work. Pt states she "takes depakote" for her seizures. Pt states after her seizure today she "felt sore" due to her chronic pain, so she took a tramadol. Pt states she then decided to drive herself home from work after taking the tramadol. States while she was driving she felt "out of it" and "like I was floating." Pt states she took a nap and this improved after she woke up. Denies palpitations, no cough, no abd pain, no N/V/D, no fevers.     Past Medical History  Diagnosis Date  . Seizures   . Back problem   . Chronic shoulder pain    History reviewed. No pertinent past surgical history.  Family History  Problem Relation Age of Onset  . Mesothelioma Father    History  Substance Use Topics  . Smoking status: Current Every Day Smoker -- 0.50 packs/day    Types: Cigarettes    Start date: 09/04/2007  . Smokeless tobacco: Not on file  . Alcohol Use: No    Review of Systems ROS: Statement: All systems negative except as marked or noted in the HPI; Constitutional: Negative for fever and chills. ; ; Eyes: Negative for eye pain, redness and discharge. ; ; ENMT: Negative for ear pain, hoarseness, nasal congestion, sinus pressure and sore throat. ; ; Cardiovascular: +CP, SOB. Negative for palpitations, diaphoresis, and peripheral edema. ; ;  Respiratory: Negative for cough, wheezing and stridor. ; ; Gastrointestinal: Negative for nausea, vomiting, diarrhea, abdominal pain, blood in stool, hematemesis, jaundice and rectal bleeding. . ; ; Genitourinary: Negative for dysuria, flank pain and hematuria. ; ; Musculoskeletal: Negative for back pain and neck pain. Negative for swelling and trauma.; ; Skin: Negative for pruritus, rash, abrasions, blisters, bruising and skin lesion.; ; Neuro: Negative for headache, lightheadedness and neck stiffness. Negative for weakness, extremity weakness, paresthesias, +seizure, AMS.      Allergies  Tramadol; Gabapentin; Naproxen; Penicillins; Vicodin; and Toradol  Home Medications   Prior to Admission medications   Medication Sig Start Date End Date Taking? Authorizing Provider  doxepin (SINEQUAN) 25 MG capsule Take 25 mg by mouth at bedtime as needed (for sleep).   Yes Historical Provider, MD  oxyCODONE-acetaminophen (PERCOCET) 10-325 MG per tablet Take 1-2 tablets by mouth every 4 (four) hours as needed for pain.    Yes Historical Provider, MD  zolpidem (AMBIEN) 10 MG tablet Take 10 mg by mouth at bedtime as needed for sleep.   Yes Historical Provider, MD   BP 109/68  Pulse 66  Temp(Src) 98.2 F (36.8 C) (Oral)  Resp 20  SpO2 100%  LMP 04/07/2014 Physical Exam 2155; Physical examination:  Nursing notes reviewed; Vital signs and O2 SAT reviewed;  Constitutional: Well developed, Well nourished, Well hydrated, In no acute distress; Head:  Normocephalic, atraumatic; Eyes: EOMI, PERRL, No scleral icterus; ENMT: Mouth and pharynx normal, Mucous membranes moist; Neck: Supple, Full range of motion, No lymphadenopathy; Cardiovascular: Regular rate and rhythm, No murmur, rub, or gallop; Respiratory: Breath sounds clear & equal bilaterally, No rales, rhonchi, wheezes.  Speaking full sentences with ease, Normal respiratory effort/excursion; Chest: Nontender, Movement normal; Abdomen: Soft, Nontender,  Nondistended, Normal bowel sounds; Genitourinary: No CVA tenderness; Extremities: Pulses normal, No tenderness, No edema, No calf edema or asymmetry.; Neuro: AA&Ox3, Major CN grossly intact. No facial droop. Speech clear. No gross focal motor or sensory deficits in extremities.; Skin: Color normal, Warm, Dry.    ED Course  Procedures     EKG Interpretation   Date/Time:  Thursday April 14 2014 19:47:48 EDT Ventricular Rate:  67 PR Interval:  184 QRS Duration: 86 QT Interval:  406 QTC Calculation: 429 R Axis:   56 Text Interpretation:  Normal sinus rhythm Nonspecific T wave abnormality  Abnormal ECG Confirmed by [facility]  MD, Nunzio Cory [phone]) on 04/14/2014  10:17:32 PM      MDM  MDM Reviewed: previous chart, nursing note and vitals Reviewed previous: labs and ECG Interpretation: labs, x-ray and ECG    Results for orders placed during the hospital encounter of 04/14/14  CBC WITH DIFFERENTIAL      Result Value Ref Range   WBC 7.5  4.0 - 10.5 K/uL   RBC 4.45  3.87 - 5.11 MIL/uL   Hemoglobin 11.8 (*) 12.0 - 15.0 g/dL   HCT 35.4 (*) 36.0 - 46.0 %   MCV 79.6  78.0 - 100.0 fL   MCH 26.5  26.0 - 34.0 pg   MCHC 33.3  30.0 - 36.0 g/dL   RDW 16.0 (*) 11.5 - 15.5 %   Platelets 359  150 - 400 K/uL   Neutrophils Relative % 45  43 - 77 %   Neutro Abs 3.4  1.7 - 7.7 K/uL   Lymphocytes Relative 49 (*) 12 - 46 %   Lymphs Abs 3.6  0.7 - 4.0 K/uL   Monocytes Relative 4  3 - 12 %   Monocytes Absolute 0.3  0.1 - 1.0 K/uL   Eosinophils Relative 2  0 - 5 %   Eosinophils Absolute 0.2  0.0 - 0.7 K/uL   Basophils Relative 0  0 - 1 %   Basophils Absolute 0.0  0.0 - 0.1 K/uL  COMPREHENSIVE METABOLIC PANEL      Result Value Ref Range   Sodium 140  137 - 147 mEq/L   Potassium 3.0 (*) 3.7 - 5.3 mEq/L   Chloride 102  96 - 112 mEq/L   CO2 25  19 - 32 mEq/L   Glucose, Bld 111 (*) 70 - 99 mg/dL   BUN 4 (*) 6 - 23 mg/dL   Creatinine, Ser 0.78  0.50 - 1.10 mg/dL   Calcium 8.8  8.4 - 10.5  mg/dL   Total Protein 7.1  6.0 - 8.3 g/dL   Albumin 3.7  3.5 - 5.2 g/dL   AST 24  0 - 37 U/L   ALT 17  0 - 35 U/L   Alkaline Phosphatase 110  39 - 117 U/L   Total Bilirubin 0.6  0.3 - 1.2 mg/dL   GFR calc non Af Amer >90  >90 mL/min   GFR calc Af Amer >90  >90 mL/min   Anion gap 13  5 - 15  TROPONIN I      Result Value Ref Range  Troponin I <0.30  <0.30 ng/mL   Dg Chest 2 View 04/14/2014   CLINICAL DATA:  Left chest pain  EXAM: CHEST  2 VIEW  COMPARISON:  10/21/13  FINDINGS: The heart size and mediastinal contours are within normal limits. Both lungs are clear. The visualized skeletal structures are unremarkable.  IMPRESSION: No active cardiopulmonary disease.   Electronically Signed   By: Earle Gell M.D.   On: 04/14/2014 20:24    2255:  Pt states she takes depakote for her seizures, but level is subtherapeutic. Pt queried regarding this: now states she has not taken her depakote "in at least 2 months."  Will load IV.  ASA and SL ntg given with improvement in CP. IVF given with improvement of hypotension. Pt has been evaluated in the ED for CP previously and left AMA; states she did not f/u with PMD or Cards. HEART score 3, will observation admit. Dx and testing d/w pt and family.  Questions answered.  Verb understanding, agreeable to observation admit. T/C to [facility] Dr. Cruzita Lederer, case discussed, including:  HPI, pertinent PM/SHx, VS/PE, dx testing, ED course and treatment:  Agreeable to admit, requests to write temporary orders, obtain observation tele bed.   Francine Graven, DO 04/15/14 1542

## 2014-04-14 NOTE — ED Notes (Signed)
Pt called stating her chest was hurting again & she felt SOB. Pt o2 sats 99% on room air. Pt talked to & started calming down. Pt placed on 2l of O2 via nasal canula to help w/ pt anxiety

## 2014-04-15 DIAGNOSIS — R072 Precordial pain: Secondary | ICD-10-CM

## 2014-04-15 DIAGNOSIS — I369 Nonrheumatic tricuspid valve disorder, unspecified: Secondary | ICD-10-CM

## 2014-04-15 LAB — TROPONIN I: Troponin I: <0.3 ng/mL (ref <0.30)

## 2014-04-15 LAB — HEMOGLOBIN A1C
HEMOGLOBIN A1C: 6.4 % — AB (ref <5.7)
MEAN PLASMA GLUCOSE: 137 mg/dL — AB (ref <117)

## 2014-04-15 LAB — TSH: TSH: 2.65 u[IU]/mL (ref 0.350–4.500)

## 2014-04-15 MED ORDER — ENOXAPARIN SODIUM 40 MG/0.4ML ~~LOC~~ SOLN
40.0000 mg | SUBCUTANEOUS | Status: DC
  Administered 2014-04-15 – 2014-04-16 (×2): 40 mg via SUBCUTANEOUS
  Filled 2014-04-15 (×2): qty 0.4

## 2014-04-15 MED ORDER — PANTOPRAZOLE SODIUM 40 MG PO TBEC
40.0000 mg | DELAYED_RELEASE_TABLET | Freq: Two times a day (BID) | ORAL | Status: DC
  Administered 2014-04-15 – 2014-04-16 (×3): 40 mg via ORAL
  Filled 2014-04-15 (×3): qty 1

## 2014-04-15 MED ORDER — PREGABALIN 25 MG PO CAPS
25.0000 mg | ORAL_CAPSULE | Freq: Two times a day (BID) | ORAL | Status: DC
  Administered 2014-04-15 – 2014-04-16 (×2): 25 mg via ORAL
  Filled 2014-04-15 (×2): qty 1

## 2014-04-15 MED ORDER — PNEUMOCOCCAL VAC POLYVALENT 25 MCG/0.5ML IJ INJ
0.5000 mL | INJECTION | INTRAMUSCULAR | Status: AC
  Administered 2014-04-16: 0.5 mL via INTRAMUSCULAR
  Filled 2014-04-15: qty 0.5

## 2014-04-15 MED ORDER — INFLUENZA VAC SPLIT QUAD 0.5 ML IM SUSY
0.5000 mL | PREFILLED_SYRINGE | INTRAMUSCULAR | Status: AC
  Administered 2014-04-16: 0.5 mL via INTRAMUSCULAR
  Filled 2014-04-15: qty 0.5

## 2014-04-15 MED ORDER — OXYCODONE-ACETAMINOPHEN 5-325 MG PO TABS
1.0000 | ORAL_TABLET | ORAL | Status: DC | PRN
  Administered 2014-04-15 – 2014-04-16 (×6): 2 via ORAL
  Filled 2014-04-15 (×6): qty 2

## 2014-04-15 MED ORDER — ZOLPIDEM TARTRATE 5 MG PO TABS
5.0000 mg | ORAL_TABLET | Freq: Every evening | ORAL | Status: DC | PRN
  Administered 2014-04-15 (×2): 5 mg via ORAL
  Filled 2014-04-15 (×2): qty 1

## 2014-04-15 MED ORDER — ASPIRIN EC 81 MG PO TBEC
81.0000 mg | DELAYED_RELEASE_TABLET | Freq: Every day | ORAL | Status: DC
  Administered 2014-04-15 – 2014-04-16 (×2): 81 mg via ORAL
  Filled 2014-04-15 (×2): qty 1

## 2014-04-15 MED ORDER — NICOTINE 21 MG/24HR TD PT24
21.0000 mg | MEDICATED_PATCH | Freq: Every day | TRANSDERMAL | Status: DC
  Administered 2014-04-15 – 2014-04-16 (×2): 21 mg via TRANSDERMAL
  Filled 2014-04-15 (×2): qty 1

## 2014-04-15 NOTE — Progress Notes (Signed)
*  PRELIMINARY RESULTS* Echocardiogram 2D Echocardiogram has been performed.  Leavy Cella 04/15/2014, 3:29 PM

## 2014-04-15 NOTE — Progress Notes (Signed)
Patient seen and examined. Admitted after midnight secondary to CP. Atypical presentation with some typical features (especially angina with exertion). Please referred to H&P written by Dr. Cruzita Lederer for further details/info on admission.  Plan: -follow 2-D echo -troponin has been negative X 3 -will check TSH, lipid profile and A1C -numbness on left arm and fingers appears to 2/2 nerve inpinchment around shoulder area (L); something like rotator cuff problems. Will start lyrica BID, continue pain meds and check B12. TSH as mentioned above -follow clinical response -start PPI BID -will benefit of EGD as an outpatient.  Barton Dubois [phone]

## 2014-04-15 NOTE — Progress Notes (Signed)
Utilization review completed.  

## 2014-04-15 NOTE — Progress Notes (Signed)
Pt states she hasn't had a bowel movement in over 2 weeks. Abdoment is tender. States she had an enema a week ago and only water came out. Pt states she only drinks Dr. Malachi Bonds at home; she doesn't eat. Pt has Dr. Malachi Bonds at bedside. Per patient she vomits when she tries to eat food. Will continue to monitor.

## 2014-04-16 DIAGNOSIS — M719 Bursopathy, unspecified: Secondary | ICD-10-CM

## 2014-04-16 DIAGNOSIS — E876 Hypokalemia: Secondary | ICD-10-CM

## 2014-04-16 DIAGNOSIS — K219 Gastro-esophageal reflux disease without esophagitis: Secondary | ICD-10-CM

## 2014-04-16 DIAGNOSIS — R7309 Other abnormal glucose: Secondary | ICD-10-CM

## 2014-04-16 DIAGNOSIS — R569 Unspecified convulsions: Secondary | ICD-10-CM

## 2014-04-16 DIAGNOSIS — M67919 Unspecified disorder of synovium and tendon, unspecified shoulder: Secondary | ICD-10-CM

## 2014-04-16 LAB — LIPID PANEL
Cholesterol: 129 mg/dL (ref 0–200)
HDL: 43 mg/dL (ref >39)
LDL CALC: 72 mg/dL (ref 0–99)
Total CHOL/HDL Ratio: 3 RATIO
Triglycerides: 71 mg/dL (ref <150)
VLDL: 14 mg/dL (ref 0–40)

## 2014-04-16 LAB — VITAMIN B12: Vitamin B-12: 621 pg/mL (ref 211–911)

## 2014-04-16 MED ORDER — PANTOPRAZOLE SODIUM 40 MG PO TBEC: 40.0000 mg | DELAYED_RELEASE_TABLET | Freq: Two times a day (BID) | ORAL | Status: DC

## 2014-04-16 MED ORDER — POLYETHYLENE GLYCOL 3350 17 G PO PACK: 17.0000 g | PACK | Freq: Every day | ORAL | Status: DC

## 2014-04-16 MED ORDER — OXYCODONE-ACETAMINOPHEN 5-325 MG PO TABS
2.0000 | ORAL_TABLET | ORAL | Status: DC | PRN
Start: 2014-04-16 — End: 2014-04-16
  Administered 2014-04-16: 2 via ORAL
  Filled 2014-04-16: qty 2

## 2014-04-16 MED ORDER — DIVALPROEX SODIUM ER 500 MG PO TB24: 500.0000 mg | ORAL_TABLET | Freq: Every day | ORAL | Status: DC

## 2014-04-16 MED ORDER — NICOTINE 21 MG/24HR TD PT24: 21.0000 mg | MEDICATED_PATCH | Freq: Every day | TRANSDERMAL | Status: DC

## 2014-04-16 MED ORDER — OXYCODONE-ACETAMINOPHEN 10-325 MG PO TABS: 2.0000 | ORAL_TABLET | Freq: Four times a day (QID) | ORAL | Status: DC | PRN

## 2014-04-16 MED ORDER — GI COCKTAIL ~~LOC~~
30.0000 mL | Freq: Three times a day (TID) | ORAL | Status: DC | PRN
  Administered 2014-04-16: 30 mL via ORAL
  Filled 2014-04-16: qty 30

## 2014-04-16 MED ORDER — PREGABALIN 25 MG PO CAPS: 25.0000 mg | ORAL_CAPSULE | Freq: Two times a day (BID) | ORAL | Status: DC

## 2014-04-16 MED ORDER — ASPIRIN 81 MG PO TBEC: 81.0000 mg | DELAYED_RELEASE_TABLET | Freq: Every day | ORAL | Status: DC

## 2014-04-16 NOTE — Discharge Summary (Signed)
Physician Discharge Summary  Janet Cohen HWE:[id number] DOB: 09-Jun-1966 DOA: 04/14/2014  PCP: Redge Gainer, MD  Admit date: 04/14/2014 Discharge date: 04/16/2014  Time spent: >30 minutes  Recommendations for Outpatient Follow-up:  -Follow CBG's and repeat A1C in 3 months; if still elevated will need hypoglycemic regimen -Please make sure patient follow with orthopedic service and GI (EGD and colonoscopy)  Discharge Diagnoses:  Active Problems:   Chest pain   Seizure disorder GERD Elevated A1C Tobacco abuse Depression Left shoulder rotator cuff  Discharge Condition: stable and improved. Discharge home with instructions to follow with PCP in 10 days and to arrange follow up with orthopedic service to look after her shoulder problem and GI for EGD/screening colonoscopy.  Diet recommendation: low carb diet  Filed Weights   04/15/14 0017  Weight: 74.345 kg (163 lb 14.4 oz)    History of present illness:  48 y.o. female has a past medical history significant for seizure disorder, chronic back pain presents to the ED with chief complaint of chest pain that started today ~3 pm. Her chest pain was feeling as pressure like with irradiation into left arm and she endorses finger numbness with it. She endorses a history of recurrent chest pain associated with stress but also with exertion. 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 She usually has to stop and rest and in ~30 minutes her chest pressure improves. She was here with similar complaints in March of this year but left AMA. In the ED patient was given NTG and aspirin with resolution of her pain. She denies fever/chills, denies GI or GU symptoms.    Hospital Course:  1-atypical chest pain: with neg troponin, EKG, telemetry and 2-d echo results. -patient  advise to stop smoking -will treat and addresses GERD/left shoulder problems, as these appears to be causes of her CP  2-GERD: will start patient on protonix BID; she will follow with GI for outpatient EGD (will also need screening colonoscopy)  3-left shoulder pain: due to rotator cuff impingement syndrome most likely -will require MRI and ortho evaluation -TSH and B12 WNL -will continue pain meds and lyrica to help with paresthesia  4-seizure disorders: continue depakote. Patient advise to be compliant with medications -no seizure appreciated during inpatient state  5-tobacco abuse: cessation counseling provided -discharge with prescriptions for nicotine patch  6-depression: continue home medication regimen  7-elevated A1C: 6.4; no prior history of diabetes -advise to follow low carb diet and avoid regular sodas -will follow with PCP for repeat A1C in 3 months and start hypoglycemic regimen if failed lifestyle modifications.   Procedures: 2-D echo: 04/14/14 - Left ventricle: The cavity size was normal. Wall thickness was increased in a pattern of mild LVH. Systolic function was normal. The estimated ejection fraction was in the range of 60% to 65%. Wall motion was normal; there were no regional wall motion abnormalities. Left ventricular diastolic function parameters were normal.   Consultations:  None   Discharge Exam: Filed Vitals:   04/16/14 1435  BP: 107/59  Pulse: 72  Temp: 98.7 F (37.1 C)  Resp: 16    General: AAOX3, complaining of some pain on her left shoulder and mild left fingers.arm paresthesia Cardiovascular: S1 and S2, no rubs or gallops Respiratory: CTA bilaterally Abdomen: soft, ND, positive BS Extremities: no  edema, no cyanosis; left shoulder with decrease range of motion and pain especially with anterior rotation/abduction  Discharge Instructions You were cared for by a hospitalist during your hospital stay. If you have any questions about your  discharge medications or the care you received while you were in the hospital after you are discharged, you can call the unit and asked to speak with the hospitalist on call if the hospitalist that took care of you is not available. Once you are discharged, your primary care physician will handle any further medical issues. Please note that NO REFILLS for any discharge medications will be authorized once you are discharged, as it is imperative that you return to your primary care physician (or establish a relationship with a primary care physician if you do not have one) for your aftercare needs so that they can reassess your need for medications and monitor your lab values.  Discharge Instructions   Discharge instructions    Complete by:  As directed   Keep yourself well hydrated Follow a low carbohydrates diet Stop drinking carbonated substances (Sodas) Arrange follow up with PCP in 10 days Arrange follow up with GI (Dr. Oneida Alar or Dr. Gala Romney) as an outpatient for EGD; 1-2 weeks Arrange follow up with orthopedic service ([facility], good practice for you to follow with, for shoulder issues)          Current Discharge Medication List    START taking these medications   Details  aspirin EC 81 MG EC tablet Take 1 tablet (81 mg total) by mouth daily. Qty: 30 tablet, Refills: 1    divalproex (DEPAKOTE ER) 500 MG 24 hr tablet Take 1 tablet (500 mg total) by mouth daily. Qty: 30 tablet, Refills: 1    nicotine (NICODERM CQ - DOSED IN MG/24 HOURS) 21 mg/24hr patch Place 1 patch (21 mg total) onto the skin daily. Qty: 28 patch, Refills: 0    pantoprazole (PROTONIX) 40 MG tablet Take 1 tablet (40 mg total) by mouth 2 (two) times daily. Qty: 60 tablet, Refills: 1    polyethylene glycol (MIRALAX / GLYCOLAX) packet Take 17 g by mouth daily. Qty: 14 each, Refills: 0    pregabalin (LYRICA) 25 MG capsule Take 1 capsule (25 mg total) by mouth 2 (two) times daily. Qty: 60 capsule, Refills:  1      CONTINUE these medications which have CHANGED   Details  oxyCODONE-acetaminophen (PERCOCET) 10-325 MG per tablet Take 2 tablets by mouth every 6 (six) hours as needed for pain. Qty: 30 tablet, Refills: 0      CONTINUE these medications which have NOT CHANGED   Details  doxepin (SINEQUAN) 25 MG capsule Take 25 mg by mouth at bedtime as needed (for sleep).    zolpidem (AMBIEN) 10 MG tablet Take 10 mg by mouth at bedtime as needed for sleep.       Allergies  Allergen Reactions  . Tramadol Hives  . Gabapentin Hives  . Naproxen Hives  . Penicillins Swelling  . Vicodin [Hydrocodone-Acetaminophen] Itching  . Toradol [Ketorolac Tromethamine] Other (See Comments)    Syncope per pt   Follow-up Information   Follow up with Redge Gainer, MD In 10 days.   Specialty:  Family Medicine   Contact information:   [place] [address] [phone]       Schedule an appointment as soon as possible for a visit with Barney Drain, MD. (1-2 weeks for EGD)    Specialty:  Gastroenterology  Contact information:   [place] [address] [place] [address] [phone]       Schedule an appointment as soon as possible for a visit with [facility]. (as soon as possible to be seen for left shoulder problems )    Specialty:  Specialist   Contact information:   [address] [place] [address] [place] [address] [phone]        The results of significant diagnostics from this hospitalization (including imaging, microbiology, ancillary and laboratory) are listed below for reference.    Significant Diagnostic Studies: Dg Chest 2 View  04/14/2014   CLINICAL DATA:  Left chest pain  EXAM: CHEST  2 VIEW  COMPARISON:  10/21/13  FINDINGS: The heart size and mediastinal contours are within normal limits. Both lungs are clear. The visualized skeletal structures are unremarkable.  IMPRESSION: No active cardiopulmonary disease.    Electronically Signed   By: Earle Gell M.D.   On: 04/14/2014 20:24   Labs: Basic Metabolic Panel:  Recent Labs Lab 04/14/14 2035  NA 140  K 3.0*  CL 102  CO2 25  GLUCOSE 111*  BUN 4*  CREATININE 0.78  CALCIUM 8.8   Liver Function Tests:  Recent Labs Lab 04/14/14 2035  AST 24  ALT 17  ALKPHOS 110  BILITOT 0.6  PROT 7.1  ALBUMIN 3.7   CBC:  Recent Labs Lab 04/14/14 2035  WBC 7.5  NEUTROABS 3.4  HGB 11.8*  HCT 35.4*  MCV 79.6  PLT 359   Cardiac Enzymes:  Recent Labs Lab 04/14/14 2035 04/14/14 2347 04/15/14 0216 04/15/14 0614  TROPONINI <0.30 <0.30 <0.30 <0.30    Signed:  Barton Dubois  [facility] 04/16/2014, 3:33 PM

## 2014-04-16 NOTE — Progress Notes (Signed)
Patient discharged home with instructions given on medications,and follow up visits,patient verbalized understanding.Prescriptions sent with patient.Vital  Signs stable.No c/o pain or discomfort noted. Accompanied by staff to an awaiting vehicle.

## 2014-06-21 ENCOUNTER — Emergency Department (HOSPITAL_COMMUNITY): Payer: PRIVATE HEALTH INSURANCE

## 2014-06-21 ENCOUNTER — Emergency Department (HOSPITAL_COMMUNITY)
Admission: EM | Admit: 2014-06-21 | Discharge: 2014-06-21 | Disposition: A | Discharge status: Home / Self Care | Payer: PRIVATE HEALTH INSURANCE | Attending: Emergency Medicine | Admitting: Emergency Medicine

## 2014-06-21 ENCOUNTER — Encounter (HOSPITAL_COMMUNITY): Payer: Self-pay | Admitting: Emergency Medicine

## 2014-06-21 DIAGNOSIS — R112 Nausea with vomiting, unspecified: Secondary | ICD-10-CM | POA: Insufficient documentation

## 2014-06-21 DIAGNOSIS — Z880 Allergy status to penicillin: Secondary | ICD-10-CM | POA: Insufficient documentation

## 2014-06-21 DIAGNOSIS — I10 Essential (primary) hypertension: Secondary | ICD-10-CM | POA: Insufficient documentation

## 2014-06-21 DIAGNOSIS — Z8669 Personal history of other diseases of the nervous system and sense organs: Secondary | ICD-10-CM | POA: Diagnosis not present

## 2014-06-21 DIAGNOSIS — Z79899 Other long term (current) drug therapy: Secondary | ICD-10-CM | POA: Insufficient documentation

## 2014-06-21 DIAGNOSIS — R51 Headache: Secondary | ICD-10-CM | POA: Insufficient documentation

## 2014-06-21 DIAGNOSIS — R079 Chest pain, unspecified: Secondary | ICD-10-CM | POA: Insufficient documentation

## 2014-06-21 DIAGNOSIS — Z720 Tobacco use: Secondary | ICD-10-CM | POA: Insufficient documentation

## 2014-06-21 DIAGNOSIS — Z7982 Long term (current) use of aspirin: Secondary | ICD-10-CM | POA: Diagnosis not present

## 2014-06-21 DIAGNOSIS — R0602 Shortness of breath: Secondary | ICD-10-CM | POA: Insufficient documentation

## 2014-06-21 DIAGNOSIS — G8929 Other chronic pain: Secondary | ICD-10-CM | POA: Insufficient documentation

## 2014-06-21 HISTORY — DX: Essential (primary) hypertension: I10

## 2014-06-21 LAB — CBC WITH DIFFERENTIAL/PLATELET
Basophils Absolute: 0 10*3/uL (ref 0.0–0.1)
Basophils Relative: 0 % (ref 0–1)
Eosinophils Absolute: 0.1 10*3/uL (ref 0.0–0.7)
Eosinophils Relative: 2 % (ref 0–5)
HCT: 34.2 % — ABNORMAL LOW (ref 36.0–46.0)
Hemoglobin: 11.5 g/dL — ABNORMAL LOW (ref 12.0–15.0)
LYMPHS ABS: 1.9 10*3/uL (ref 0.7–4.0)
LYMPHS PCT: 27 % (ref 12–46)
MCH: 26.4 pg (ref 26.0–34.0)
MCHC: 33.6 g/dL (ref 30.0–36.0)
MCV: 78.6 fL (ref 78.0–100.0)
Monocytes Absolute: 0.3 10*3/uL (ref 0.1–1.0)
Monocytes Relative: 4 % (ref 3–12)
Neutro Abs: 4.8 10*3/uL (ref 1.7–7.7)
Neutrophils Relative %: 67 % (ref 43–77)
Platelets: 291 10*3/uL (ref 150–400)
RBC: 4.35 MIL/uL (ref 3.87–5.11)
RDW: 16 % — ABNORMAL HIGH (ref 11.5–15.5)
WBC: 7.2 10*3/uL (ref 4.0–10.5)

## 2014-06-21 LAB — BASIC METABOLIC PANEL
Anion gap: 14 (ref 5–15)
BUN: 5 mg/dL — AB (ref 6–23)
CALCIUM: 9.2 mg/dL (ref 8.4–10.5)
CO2: 25 meq/L (ref 19–32)
Chloride: 101 mEq/L (ref 96–112)
Creatinine, Ser: 0.62 mg/dL (ref 0.50–1.10)
GFR calc Af Amer: >90 mL/min (ref >90)
GFR calc non Af Amer: >90 mL/min (ref >90)
GLUCOSE: 101 mg/dL — AB (ref 70–99)
Potassium: 3.2 mEq/L — ABNORMAL LOW (ref 3.7–5.3)
Sodium: 140 mEq/L (ref 137–147)

## 2014-06-21 LAB — I-STAT TROPONIN, ED
Troponin i, poc: 0.01 ng/mL (ref 0.00–0.08)
Troponin i, poc: 0.02 ng/mL (ref 0.00–0.08)

## 2014-06-21 LAB — VALPROIC ACID LEVEL: Valproic Acid Lvl: <10 ug/mL — ABNORMAL LOW (ref 50.0–100.0)

## 2014-06-21 MED ORDER — OXYCODONE-ACETAMINOPHEN 5-325 MG PO TABS
1.0000 | ORAL_TABLET | Freq: Once | ORAL | Status: AC
  Administered 2014-06-21: 1 via ORAL
  Filled 2014-06-21: qty 1

## 2014-06-21 MED ORDER — ASPIRIN 81 MG PO CHEW
324.0000 mg | CHEWABLE_TABLET | Freq: Once | ORAL | Status: AC
  Administered 2014-06-21: 324 mg via ORAL
  Filled 2014-06-21: qty 4

## 2014-06-21 MED ORDER — DIVALPROEX SODIUM ER 500 MG PO TB24: 500.0000 mg | ORAL_TABLET | Freq: Every day | ORAL | Status: DC

## 2014-06-21 MED ORDER — DOXEPIN HCL 25 MG PO CAPS: 25.0000 mg | ORAL_CAPSULE | Freq: Every day | ORAL | Status: AC

## 2014-06-21 NOTE — Discharge Instructions (Signed)

## 2014-06-21 NOTE — ED Notes (Signed)
Pt c/o left side cp pressure with n/v/sob/weakness today. Pt states she has had intermittent cold sx and n/v x a few weeks.

## 2014-06-21 NOTE — ED Provider Notes (Signed)
CSN: [id number]     Arrival date & time 06/21/14  7371 History  This chart was scribed for Sharyon Cable, MD by Edison Simon, ED Scribe. This patient was seen in room APA01/APA01 and the patient's care was started at 10:13 AM.    Chief Complaint  Patient presents with  . Chest Pain   Patient is a 48 y.o. female presenting with chest pain. The history is provided by the patient. No language interpreter was used.  Chest Pain Pain location:  L chest Pain radiates to:  Does not radiate Pain radiates to the back: no   Pain severity:  Moderate Onset quality:  Sudden Duration:  15 minutes Timing:  Constant Progression:  Resolved Chronicity:  Recurrent Context: lifting and raising an arm   Relieved by:  None tried Worsened by:  Nothing tried Ineffective treatments:  None tried Associated symptoms: headache, nausea, numbness, shortness of breath and vomiting   Shortness of breath:    Severity:  Moderate   Onset quality:  Sudden   Duration:  15 months   Timing:  Constant   Progression:  Resolved   HPI Comments: Vandella Ord is a 48 y.o. 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 She reports associated left arm numbness/pain and SOB. She states she has not had chest pain in the past few days. She reports previous instance of chest pain for which she was evaluated, and states it was attributed to anxiety and stress. She denies history of MI, CVA, or DVT. She denies FHx of cardiac disease. She notes that she has been sick the past 3 weeks with nausea, vomiting, and headache. She also states she has had a few seizures; she reports history of seizure disorder and states she has been out of her medication for some time after missing an appointment for a medication refill. She denies abdominal pain.   PMH - HTN Fam hx - negative for CAD Soc hx - smoker Past Medical  History  Diagnosis Date  . Seizures   . Back problem   . Chronic shoulder pain   . Hypertension    History reviewed. No pertinent past surgical history. Family History  Problem Relation Age of Onset  . Mesothelioma Father    History  Substance Use Topics  . Smoking status: Current Every Day Smoker -- 0.50 packs/day    Types: Cigarettes    Start date: 09/04/2007  . Smokeless tobacco: Not on file  . Alcohol Use: No   OB History    No data available     Review of Systems  Respiratory: Positive for shortness of breath.   Cardiovascular: Positive for chest pain.  Gastrointestinal: Positive for nausea and vomiting. Negative for anal bleeding.  Neurological: Positive for seizures, numbness and headaches.  All other systems reviewed and are negative.     Allergies  Tramadol; Gabapentin; Naproxen; Penicillins; Vicodin; and Toradol  Home Medications   Prior to Admission medications   Medication Sig Start Date End Date Taking? Authorizing Provider  aspirin EC 81 MG EC tablet Take 1 tablet (81 mg total) by mouth daily. 04/16/14   Barton Dubois, MD  divalproex (DEPAKOTE ER) 500 MG 24 hr tablet Take 1 tablet (500 mg total) by mouth daily. 04/16/14   Barton Dubois, MD  doxepin (SINEQUAN) 25 MG capsule Take 25 mg by mouth at bedtime as needed (for sleep).  Historical Provider, MD  nicotine (NICODERM CQ - DOSED IN MG/24 HOURS) 21 mg/24hr patch Place 1 patch (21 mg total) onto the skin daily. 04/16/14   Barton Dubois, MD  oxyCODONE-acetaminophen (PERCOCET) 10-325 MG per tablet Take 2 tablets by mouth every 6 (six) hours as needed for pain. 04/16/14   Barton Dubois, MD  pantoprazole (PROTONIX) 40 MG tablet Take 1 tablet (40 mg total) by mouth 2 (two) times daily. 04/16/14   Barton Dubois, MD  polyethylene glycol [facility] / GLYCOLAX) packet Take 17 g by mouth daily. 04/16/14   Barton Dubois, MD  pregabalin (LYRICA) 25 MG capsule Take 1 capsule (25 mg total) by mouth 2 (two) times daily.  04/16/14   Barton Dubois, MD  zolpidem (AMBIEN) 10 MG tablet Take 10 mg by mouth at bedtime as needed for sleep.    Historical Provider, MD   BP 125/85 mmHg  Pulse 82  Temp(Src) 98.1 F (36.7 C)  Resp 20  Ht 5\' 7"  (1.702 m)  Wt 120 lb (54.432 kg)  BMI 18.79 kg/m2  SpO2 98% Physical Exam  Nursing note and vitals reviewed.  CONSTITUTIONAL: Well developed/well nourished HEAD: Normocephalic/atraumatic EYES: EOMI/PERRL ENMT: Mucous membranes moist NECK: supple no meningeal signs SPINE/BACK:entire spine nontender CV: S1/S2 noted, no murmurs/rubs/gallops noted CHEST: diffuse left sided chest wall tenderness, but no crepitus noted LUNGS: Lungs are clear to auscultation bilaterally, no apparent distress ABDOMEN: soft, nontender, no rebound or guarding, bowel sounds noted throughout abdomen GU:no cva tenderness NEURO: Pt is awake/alert/appropriate, moves all extremitiesx4.  No facial droop.   EXTREMITIES: pulses normal/equal, full ROM SKIN: warm, color normal PSYCH: no abnormalities of mood noted, alert and oriented to situation  ED Course  Procedures   DIAGNOSTIC STUDIES: Oxygen Saturation is 100% on room air, normal by my interpretation.    COORDINATION OF CARE: 10:19 AM Discussed treatment plan with patient at beside, the patient agrees with the plan and has no further questions at this time.   Labs Review Labs Reviewed  CBC WITH DIFFERENTIAL - Abnormal; Notable for the following:    Hemoglobin 11.5 (*)    HCT 34.2 (*)    RDW 16.0 (*)    All other components within normal limits  BASIC METABOLIC PANEL - Abnormal; Notable for the following:    Potassium 3.2 (*)    Glucose, Bld 101 (*)    BUN 5 (*)    All other components within normal limits  VALPROIC ACID LEVEL - Abnormal; Notable for the following:    Valproic Acid Lvl <10.0 (*)    All other components within normal limits  I-STAT TROPOININ, ED  Randolm Idol, ED    Imaging Review Dg Chest 2 View  06/21/2014    CLINICAL DATA:  48 year old female with chest pain for 1 day after heavy lifting. Initial encounter.  EXAM: CHEST  2 VIEW  COMPARISON:  04/14/2014 and earlier.  FINDINGS: Mildly lower lung volumes. Normal cardiac size and mediastinal contours. Visualized tracheal air column is within normal limits. The lungs remain clear. No pneumothorax or effusion. No osseous abnormality identified.  IMPRESSION: Negative, no acute cardiopulmonary abnormality.   Electronically Signed   By: Lars Pinks M.D.   On: 06/21/2014 11:10     EKG Interpretation   Date/Time:  Tuesday June 21 2014 09:54:40 EST Ventricular Rate:  88 PR Interval:  162 QRS Duration: 87 QT Interval:  363 QTC Calculation: 439 R Axis:   67 Text Interpretation:  Sinus rhythm RSR' in V1 or V2, right  VCD or RVH No  significant change since last tracing Confirmed by [facility]  MD, [place]  [phone]) on 06/21/2014 10:11:51 AM        Date: 06/21/2014 1321  Rate: 76  Rhythm: normal sinus rhythm  QRS Axis: normal  Intervals: normal  ST/T Wave abnormalities: nonspecific ST changes  Conduction Disutrbances:nonspecific IV conduction delay  Narrative Interpretation:   Old EKG Reviewed: unchanged    Medications  aspirin chewable tablet 324 mg (324 mg Oral Given 06/21/14 1028)  oxyCODONE-acetaminophen (PERCOCET/ROXICET) 5-325 MG per tablet 1 tablet (1 tablet Oral Given 06/21/14 1028)      MDM   Patient well appearing She reports improvement She has focal left sided chest wall tenderness which she reports is exact pain she had at home while lifting horse feed.   HEART score is less than 3 No EKG changes I feel she is safe/appropriate for d/c home I doubt ACS/PE/Dissection She requests refill for her home meds prior to seeing PCP next week Final diagnoses:  Chest pain  Chest pain, unspecified chest pain type    Nursing notes including past medical history and social history reviewed and considered in documentation xrays/imaging  reviewed by myself and considered during evaluation Labs/vital reviewed myself and considered during evaluation Previous records reviewed and considered - previous hospital admission reviewed    I personally performed the services described in this documentation, which was scribed in my presence. The recorded information has been reviewed and is accurate.      Sharyon Cable, MD 06/21/14 1556

## 2014-08-05 DIAGNOSIS — I82409 Acute embolism and thrombosis of unspecified deep veins of unspecified lower extremity: Secondary | ICD-10-CM

## 2014-08-05 HISTORY — DX: Acute embolism and thrombosis of unspecified deep veins of unspecified lower extremity: I82.409

## 2015-03-21 ENCOUNTER — Emergency Department (HOSPITAL_COMMUNITY): Payer: BLUE CROSS/BLUE SHIELD

## 2015-03-21 ENCOUNTER — Encounter (HOSPITAL_COMMUNITY): Payer: Self-pay | Admitting: Emergency Medicine

## 2015-03-21 ENCOUNTER — Emergency Department (HOSPITAL_COMMUNITY)
Admission: EM | Admit: 2015-03-21 | Discharge: 2015-03-21 | Disposition: A | Discharge status: Home / Self Care | Payer: BLUE CROSS/BLUE SHIELD | Attending: Emergency Medicine | Admitting: Emergency Medicine

## 2015-03-21 DIAGNOSIS — Y998 Other external cause status: Secondary | ICD-10-CM | POA: Insufficient documentation

## 2015-03-21 DIAGNOSIS — G8929 Other chronic pain: Secondary | ICD-10-CM | POA: Diagnosis not present

## 2015-03-21 DIAGNOSIS — Y92410 Unspecified street and highway as the place of occurrence of the external cause: Secondary | ICD-10-CM | POA: Insufficient documentation

## 2015-03-21 DIAGNOSIS — Y9289 Other specified places as the place of occurrence of the external cause: Secondary | ICD-10-CM | POA: Diagnosis not present

## 2015-03-21 DIAGNOSIS — Z79899 Other long term (current) drug therapy: Secondary | ICD-10-CM | POA: Diagnosis not present

## 2015-03-21 DIAGNOSIS — S80211A Abrasion, right knee, initial encounter: Secondary | ICD-10-CM | POA: Insufficient documentation

## 2015-03-21 DIAGNOSIS — T148XXA Other injury of unspecified body region, initial encounter: Secondary | ICD-10-CM

## 2015-03-21 DIAGNOSIS — S99911A Unspecified injury of right ankle, initial encounter: Secondary | ICD-10-CM | POA: Insufficient documentation

## 2015-03-21 DIAGNOSIS — Z720 Tobacco use: Secondary | ICD-10-CM | POA: Diagnosis not present

## 2015-03-21 DIAGNOSIS — I10 Essential (primary) hypertension: Secondary | ICD-10-CM | POA: Diagnosis not present

## 2015-03-21 DIAGNOSIS — Z880 Allergy status to penicillin: Secondary | ICD-10-CM | POA: Insufficient documentation

## 2015-03-21 DIAGNOSIS — G40909 Epilepsy, unspecified, not intractable, without status epilepticus: Secondary | ICD-10-CM | POA: Diagnosis not present

## 2015-03-21 DIAGNOSIS — Z7982 Long term (current) use of aspirin: Secondary | ICD-10-CM | POA: Diagnosis not present

## 2015-03-21 DIAGNOSIS — S8991XA Unspecified injury of right lower leg, initial encounter: Secondary | ICD-10-CM | POA: Diagnosis present

## 2015-03-21 DIAGNOSIS — S6992XA Unspecified injury of left wrist, hand and finger(s), initial encounter: Secondary | ICD-10-CM | POA: Diagnosis not present

## 2015-03-21 DIAGNOSIS — T07XXXA Unspecified multiple injuries, initial encounter: Secondary | ICD-10-CM

## 2015-03-21 DIAGNOSIS — S4992XA Unspecified injury of left shoulder and upper arm, initial encounter: Secondary | ICD-10-CM | POA: Diagnosis not present

## 2015-03-21 DIAGNOSIS — Y9389 Activity, other specified: Secondary | ICD-10-CM | POA: Diagnosis not present

## 2015-03-21 DIAGNOSIS — T148 Other injury of unspecified body region: Secondary | ICD-10-CM | POA: Diagnosis not present
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
## 2015-03-21 NOTE — Discharge Instructions (Signed)

## 2015-06-14 ENCOUNTER — Encounter: Payer: Self-pay | Admitting: Neurology

## 2015-06-14 ENCOUNTER — Ambulatory Visit (INDEPENDENT_AMBULATORY_CARE_PROVIDER_SITE_OTHER): Payer: BLUE CROSS/BLUE SHIELD | Admitting: Neurology

## 2015-06-14 VITALS — BP 207/68 | HR 100 | Ht 67.0 in | Wt 178.0 lb

## 2015-06-14 DIAGNOSIS — G44321 Chronic post-traumatic headache, intractable: Secondary | ICD-10-CM

## 2015-06-14 DIAGNOSIS — S060X9A Concussion with loss of consciousness of unspecified duration, initial encounter: Secondary | ICD-10-CM | POA: Insufficient documentation

## 2015-06-14 DIAGNOSIS — G40909 Epilepsy, unspecified, not intractable, without status epilepticus: Secondary | ICD-10-CM | POA: Diagnosis not present

## 2015-06-14 DIAGNOSIS — R519 Headache, unspecified: Secondary | ICD-10-CM | POA: Insufficient documentation

## 2015-06-14 DIAGNOSIS — S060X1D Concussion with loss of consciousness of 30 minutes or less, subsequent encounter: Secondary | ICD-10-CM | POA: Diagnosis not present

## 2015-06-14 DIAGNOSIS — G8929 Other chronic pain: Secondary | ICD-10-CM | POA: Insufficient documentation

## 2015-06-14 DIAGNOSIS — R51 Headache: Secondary | ICD-10-CM

## 2015-06-14 MED ORDER — RIZATRIPTAN BENZOATE 10 MG PO TBDP: 10.0000 mg | ORAL_TABLET | ORAL | Status: AC | PRN

## 2015-06-14 MED ORDER — QUETIAPINE FUMARATE 100 MG PO TABS: 100.0000 mg | ORAL_TABLET | Freq: Every day | ORAL | Status: DC

## 2015-06-14 MED ORDER — CLONAZEPAM 1 MG PO TABS: 1.0000 mg | ORAL_TABLET | Freq: Two times a day (BID) | ORAL | Status: DC | PRN

## 2015-06-14 MED ORDER — DIVALPROEX SODIUM ER 500 MG PO TB24: 1000.0000 mg | ORAL_TABLET | Freq: Every day | ORAL | Status: AC
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
## 2015-06-21 ENCOUNTER — Ambulatory Visit: Payer: BLUE CROSS/BLUE SHIELD | Admitting: Neurology

## 2015-08-15 ENCOUNTER — Ambulatory Visit: Payer: BLUE CROSS/BLUE SHIELD | Admitting: Neurology

## 2016-01-11 IMAGING — US US EXTREM LOW VENOUS*R*
1 series · 14 of 24 positions shown · non-contrast
Comparison: None.

CLINICAL DATA: Right leg injury

EXAM:
RIGHT LOWER EXTREMITY VENOUS DUPLEX ULTRASOUND
TECHNIQUE: Doppler venous assessment of the right lower extremity deep venous
system was performed, including characterization of spectral flow,
compressibility, and phasicity.

[Series 1: us extrem low venous*right* · 0.07mm/px · 14 of 32 slices shown]
[im 1/32]
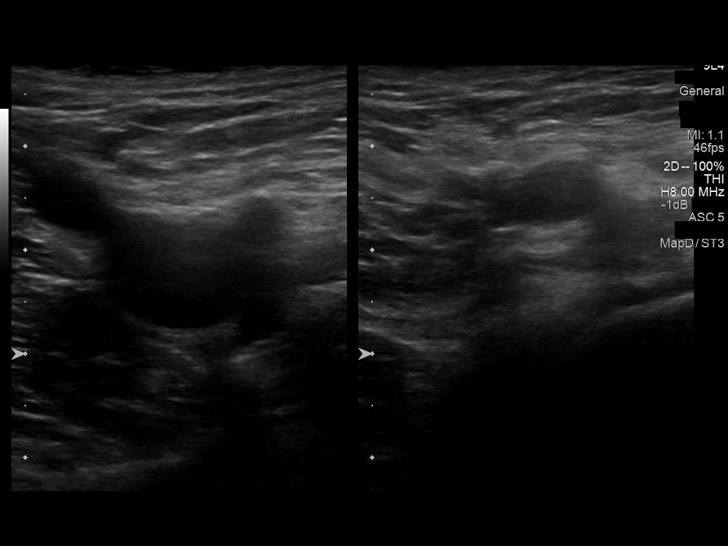
[im 3/32]
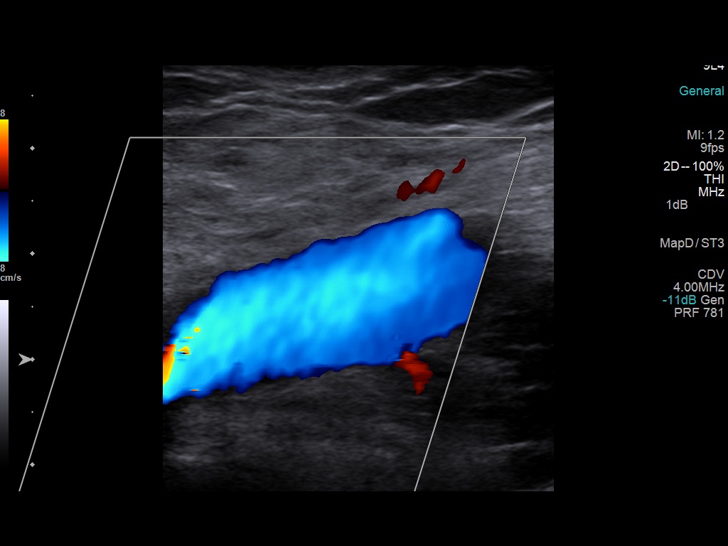
[im 6/32]
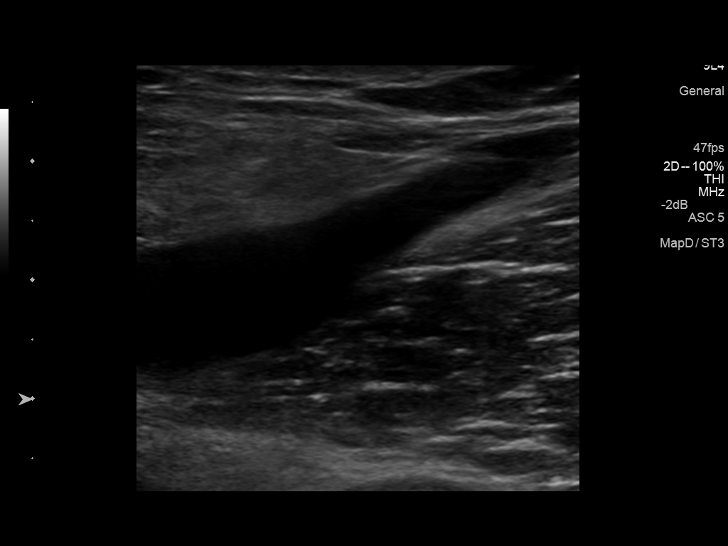
[im 9/32]
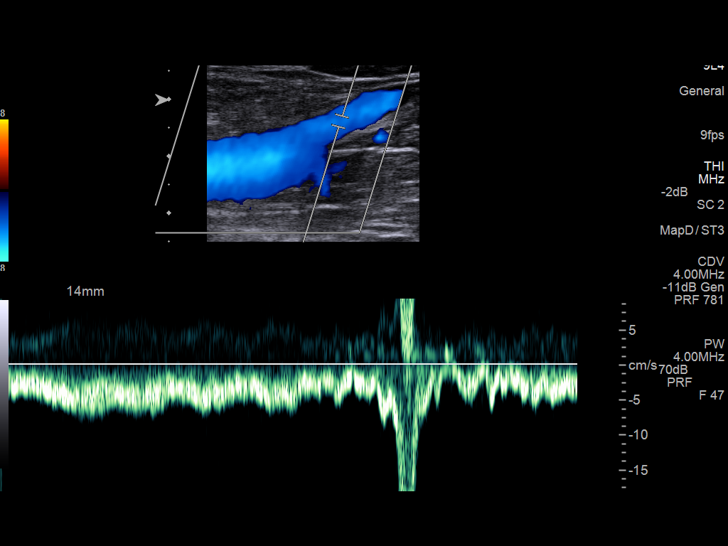
[im 10/32]
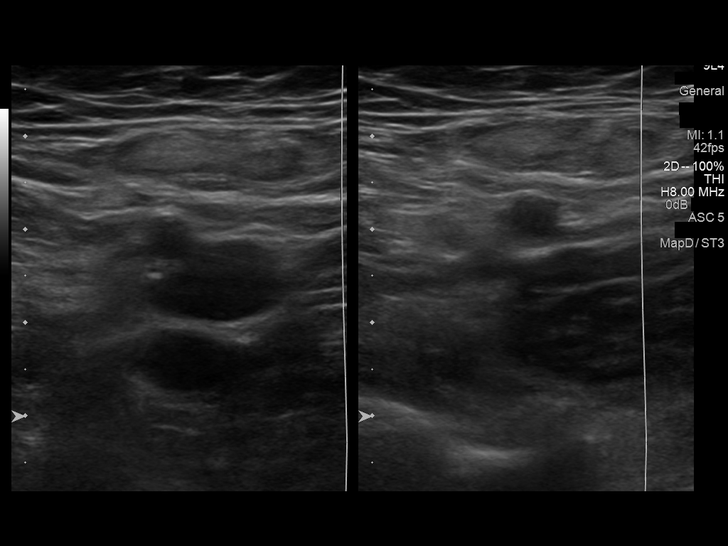
[im 13/32]
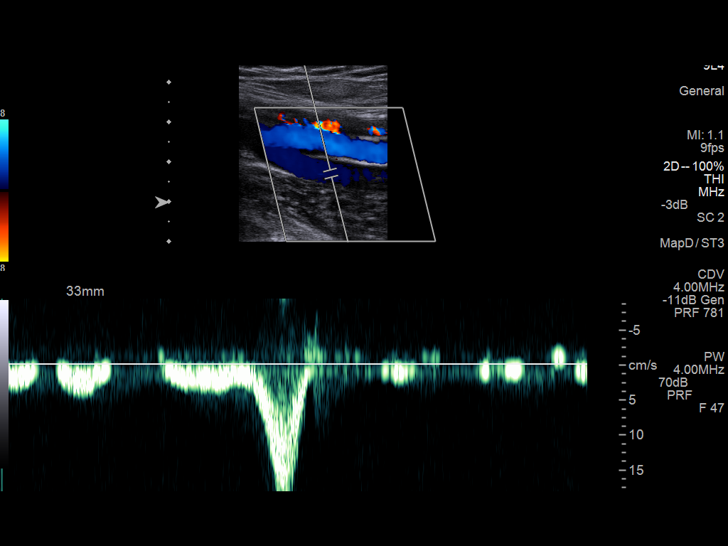
[im 15/32]
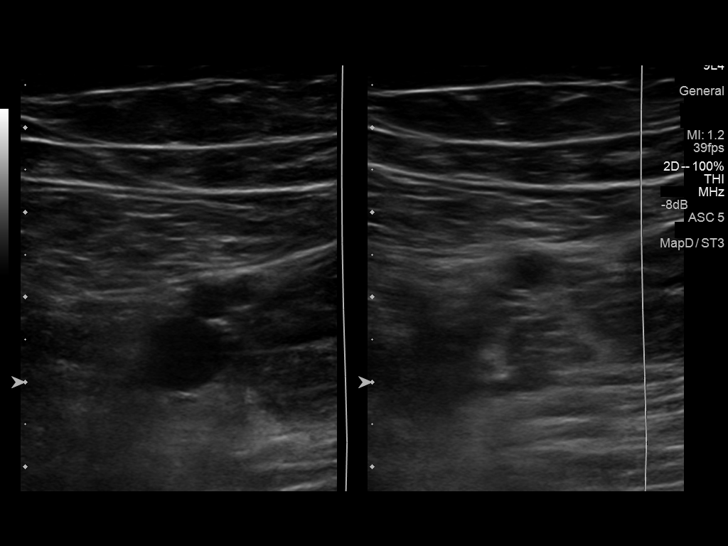
[im 17/32]
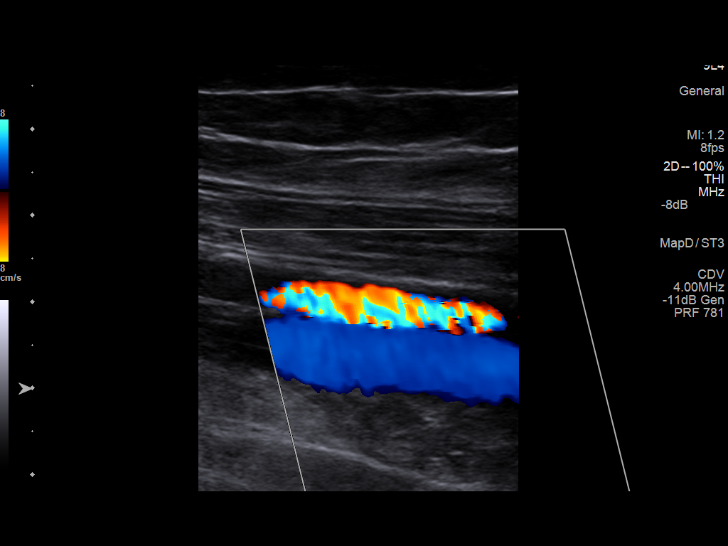
[im 19/32]
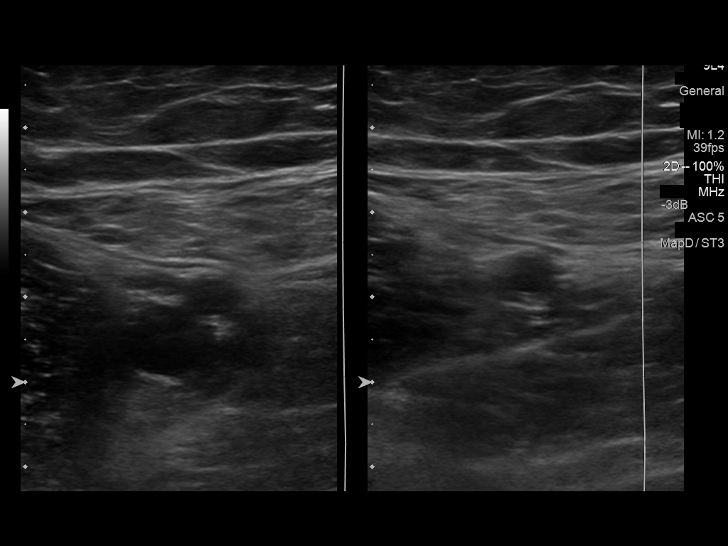
[im 22/32]
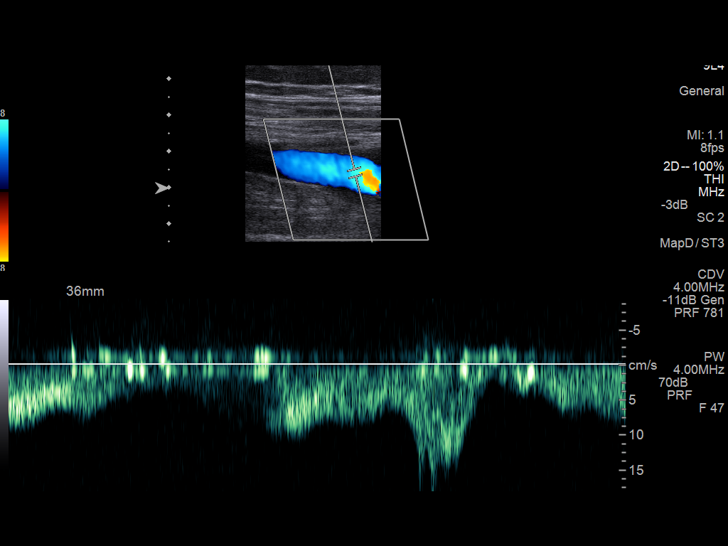
[im 25/32]
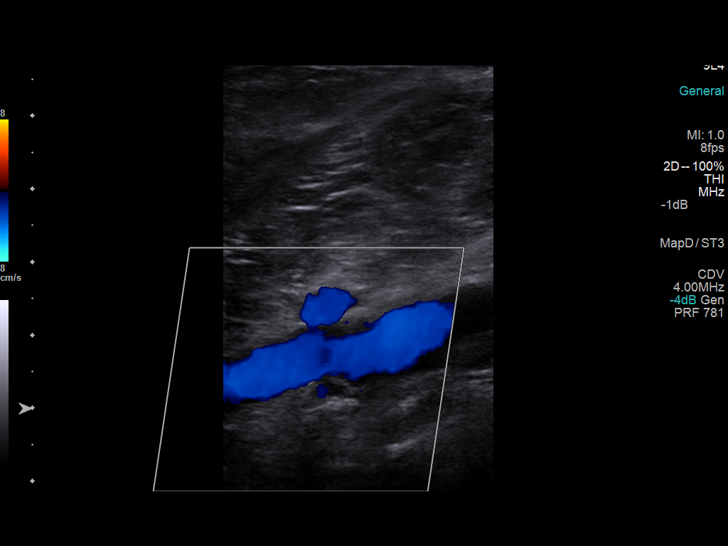
[im 26/32]
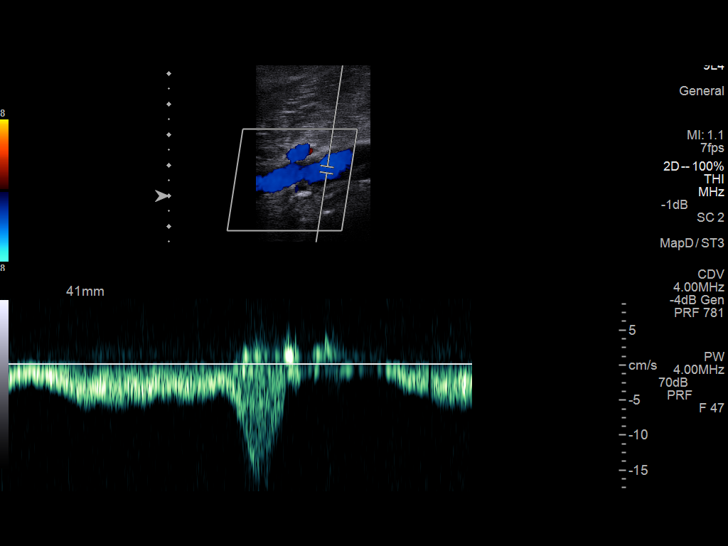
[im 29/32]
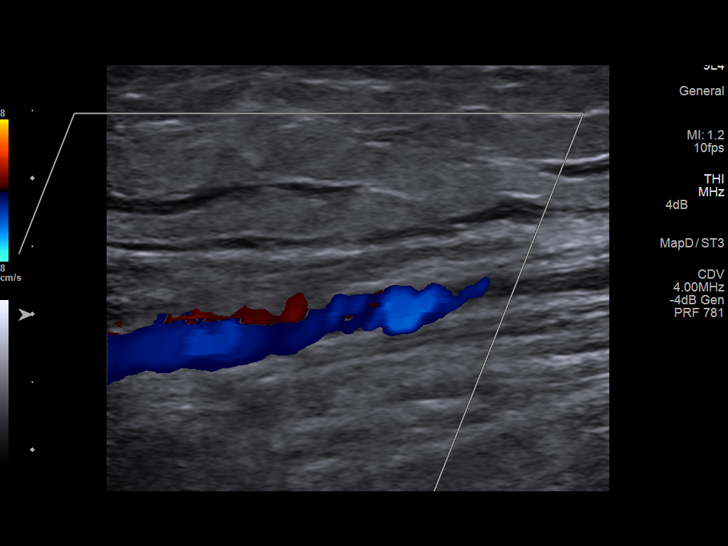
[im 32/32]
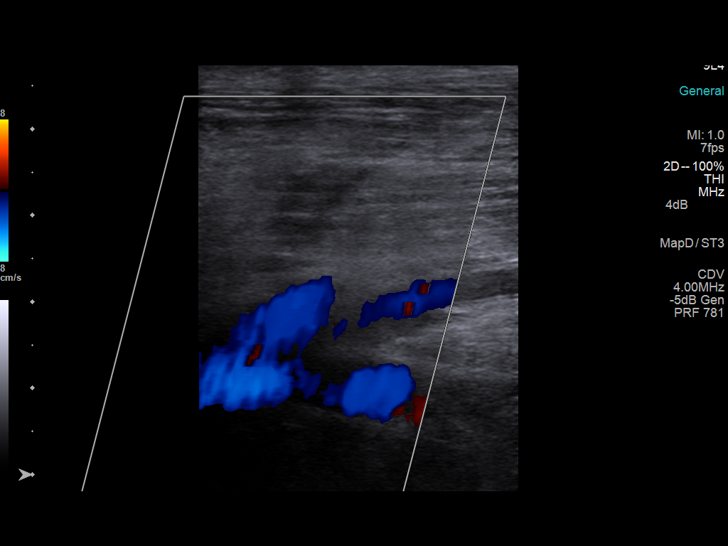

[14 of 24 positions shown; findings below may reference images not displayed]

FINDINGS: There is complete compressibility of the right common femoral,
femoral, and popliteal veins. Doppler analysis demonstrates
respiratory phasicity and augmentation of flow upon calf
compression.
IMPRESSION: No evidence of right lower extremity DVT.

## 2016-02-15 ENCOUNTER — Other Ambulatory Visit: Payer: Self-pay | Admitting: Family Medicine

## 2016-02-15 ENCOUNTER — Ambulatory Visit
Admission: RE | Admit: 2016-02-15 | Discharge: 2016-02-15 | Disposition: A | Discharge status: Home / Self Care | Payer: BLUE CROSS/BLUE SHIELD | Source: Ambulatory Visit | Attending: Family Medicine | Admitting: Family Medicine

## 2016-02-15 DIAGNOSIS — R0789 Other chest pain: Secondary | ICD-10-CM

## 2016-05-09 ENCOUNTER — Other Ambulatory Visit: Payer: Self-pay | Admitting: Family Medicine

## 2016-05-09 ENCOUNTER — Ambulatory Visit
Admission: RE | Admit: 2016-05-09 | Discharge: 2016-05-09 | Disposition: A | Discharge status: Home / Self Care | Payer: BLUE CROSS/BLUE SHIELD | Source: Ambulatory Visit | Attending: Family Medicine | Admitting: Family Medicine

## 2016-05-09 DIAGNOSIS — R7611 Nonspecific reaction to tuberculin skin test without active tuberculosis: Secondary | ICD-10-CM
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
## 2018-03-11 ENCOUNTER — Inpatient Hospital Stay (HOSPITAL_COMMUNITY)
Admission: AD | Admit: 2018-03-11 | Discharge: 2018-03-11 | Discharge status: Against Medical Advice | Payer: BLUE CROSS/BLUE SHIELD | Source: Ambulatory Visit | Attending: Obstetrics & Gynecology | Admitting: Obstetrics & Gynecology

## 2018-03-11 DIAGNOSIS — Z5321 Procedure and treatment not carried out due to patient leaving prior to being seen by health care provider: Secondary | ICD-10-CM | POA: Diagnosis not present

## 2018-03-11 DIAGNOSIS — N939 Abnormal uterine and vaginal bleeding, unspecified: Secondary | ICD-10-CM | POA: Insufficient documentation

## 2018-03-11 LAB — CBC WITH DIFFERENTIAL/PLATELET
BASOS PCT: 0 %
Basophils Absolute: 0 10*3/uL (ref 0.0–0.1)
EOS ABS: 0.1 10*3/uL (ref 0.0–0.7)
Eosinophils Relative: 2 %
HEMATOCRIT: 36.6 % (ref 36.0–46.0)
Hemoglobin: 12.1 g/dL (ref 12.0–15.0)
Lymphocytes Relative: 46 %
Lymphs Abs: 2.5 10*3/uL (ref 0.7–4.0)
MCH: 28 pg (ref 26.0–34.0)
MCHC: 33.1 g/dL (ref 30.0–36.0)
MCV: 84.7 fL (ref 78.0–100.0)
MONO ABS: 0.2 10*3/uL (ref 0.1–1.0)
Monocytes Relative: 3 %
NEUTROS ABS: 2.7 10*3/uL (ref 1.7–7.7)
Neutrophils Relative %: 49 %
Platelets: 234 10*3/uL (ref 150–400)
RBC: 4.32 MIL/uL (ref 3.87–5.11)
RDW: 14.7 % (ref 11.5–15.5)
WBC: 5.5 10*3/uL (ref 4.0–10.5)

## 2018-03-11 LAB — URINALYSIS, ROUTINE W REFLEX MICROSCOPIC
BACTERIA UA: NONE SEEN
BILIRUBIN URINE: NEGATIVE
Glucose, UA: NEGATIVE mg/dL
KETONES UR: NEGATIVE mg/dL
LEUKOCYTES UA: NEGATIVE
Nitrite: NEGATIVE
PROTEIN: NEGATIVE mg/dL
SPECIFIC GRAVITY, URINE: 1.014 (ref 1.005–1.030)
pH: 5 (ref 5.0–8.0)

## 2018-03-11 LAB — TYPE AND SCREEN
ABO/RH(D): O POS
ANTIBODY SCREEN: NEGATIVE

## 2018-03-11 LAB — POCT PREGNANCY, URINE: PREG TEST UR: NEGATIVE

## 2018-03-11 LAB — ABO/RH: ABO/RH(D): O POS

## 2018-03-11 NOTE — MAU Note (Signed)
Not in lobby

## 2018-03-11 NOTE — MAU Note (Signed)
Pt reports she began having vaginal bleeding with clots last week. Pt reports she stopped bleeding about 20 years ago.

## 2018-04-01 ENCOUNTER — Other Ambulatory Visit: Payer: Self-pay

## 2018-04-01 ENCOUNTER — Emergency Department (HOSPITAL_COMMUNITY)
Admission: EM | Admit: 2018-04-01 | Discharge: 2018-04-01 | Disposition: A | Discharge status: Home / Self Care | Payer: BLUE CROSS/BLUE SHIELD | Attending: Emergency Medicine | Admitting: Emergency Medicine

## 2018-04-01 ENCOUNTER — Encounter (HOSPITAL_COMMUNITY): Payer: Self-pay | Admitting: Emergency Medicine

## 2018-04-01 DIAGNOSIS — I10 Essential (primary) hypertension: Secondary | ICD-10-CM | POA: Insufficient documentation

## 2018-04-01 DIAGNOSIS — E876 Hypokalemia: Secondary | ICD-10-CM

## 2018-04-01 DIAGNOSIS — F17210 Nicotine dependence, cigarettes, uncomplicated: Secondary | ICD-10-CM | POA: Insufficient documentation

## 2018-04-01 DIAGNOSIS — W57XXXA Bitten or stung by nonvenomous insect and other nonvenomous arthropods, initial encounter: Secondary | ICD-10-CM

## 2018-04-01 DIAGNOSIS — S60562A Insect bite (nonvenomous) of left hand, initial encounter: Secondary | ICD-10-CM

## 2018-04-01 DIAGNOSIS — Z79899 Other long term (current) drug therapy: Secondary | ICD-10-CM | POA: Insufficient documentation

## 2018-04-01 DIAGNOSIS — L03114 Cellulitis of left upper limb: Secondary | ICD-10-CM | POA: Insufficient documentation

## 2018-04-01 LAB — BASIC METABOLIC PANEL
Anion gap: 10 (ref 5–15)
BUN: 6 mg/dL (ref 6–20)
CHLORIDE: 100 mmol/L (ref 98–111)
CO2: 30 mmol/L (ref 22–32)
Calcium: 9.2 mg/dL (ref 8.9–10.3)
Creatinine, Ser: 0.98 mg/dL (ref 0.44–1.00)
GFR calc Af Amer: >60 mL/min (ref >60)
GFR calc non Af Amer: >60 mL/min (ref >60)
Glucose, Bld: 82 mg/dL (ref 70–99)
POTASSIUM: 3.1 mmol/L — AB (ref 3.5–5.1)
SODIUM: 140 mmol/L (ref 135–145)

## 2018-04-01 LAB — CBC WITH DIFFERENTIAL/PLATELET
ABS IMMATURE GRANULOCYTES: 0 10*3/uL (ref 0.0–0.1)
BASOS ABS: 0 10*3/uL (ref 0.0–0.1)
Basophils Relative: 0 %
Eosinophils Absolute: 0.2 10*3/uL (ref 0.0–0.7)
Eosinophils Relative: 3 %
HCT: 42.4 % (ref 36.0–46.0)
HEMOGLOBIN: 13.2 g/dL (ref 12.0–15.0)
Immature Granulocytes: 0 %
LYMPHS ABS: 3.1 10*3/uL (ref 0.7–4.0)
LYMPHS PCT: 46 %
MCH: 26.5 pg (ref 26.0–34.0)
MCHC: 31.1 g/dL (ref 30.0–36.0)
MCV: 85.1 fL (ref 78.0–100.0)
MONO ABS: 0.4 10*3/uL (ref 0.1–1.0)
MONOS PCT: 6 %
NEUTROS ABS: 3.1 10*3/uL (ref 1.7–7.7)
Neutrophils Relative %: 45 %
Platelets: 329 10*3/uL (ref 150–400)
RBC: 4.98 MIL/uL (ref 3.87–5.11)
RDW: 14.2 % (ref 11.5–15.5)
WBC: 6.8 10*3/uL (ref 4.0–10.5)

## 2018-04-01 MED ORDER — CLINDAMYCIN HCL 300 MG PO CAPS: 300.0000 mg | ORAL_CAPSULE | Freq: Four times a day (QID) | ORAL | 0 refills | Status: DC

## 2018-04-01 MED ORDER — POTASSIUM CHLORIDE CRYS ER 20 MEQ PO TBCR
40.0000 meq | EXTENDED_RELEASE_TABLET | Freq: Once | ORAL | Status: AC
  Administered 2018-04-01: 40 meq via ORAL
  Filled 2018-04-01: qty 2

## 2018-04-01 MED ORDER — CLINDAMYCIN PHOSPHATE 600 MG/50ML IV SOLN
600.0000 mg | Freq: Once | INTRAVENOUS | Status: AC
  Administered 2018-04-01: 600 mg via INTRAVENOUS
  Filled 2018-04-01: qty 50

## 2018-04-01 NOTE — ED Notes (Signed)
Pt complains of a spider bite that has made her hand numb and swollen.

## 2018-04-01 NOTE — ED Provider Notes (Signed)
[facility] EMERGENCY DEPARTMENT Provider Note   CSN: [id number] Arrival date & time: 04/01/18  2042     History   Chief Complaint Chief Complaint  Patient presents with  . Insect Bite    HPI Janet Cohen is a 52 y.o. female.  Janet Cohen is a 52 y.o. Female with history of hypertension, seizures, chronic headaches, presents to the emergency department for evaluation of spider bite to the palm of her left hand.  Patient reports around 1:00 today she was out at her horse barn and felt a little pinch over her left palm and saw a small brown spider but did not think much of it, 5 hours later her palm became red and she began to have some swelling.  She reports the hand feels sore and tight and she started to have some numbness and difficulty moving the hand.  No swelling of the wrist or arm.  Patient denies any fevers or chills, no nausea or vomiting.  No numbness or tingling elsewhere.  No medications prior to arrival to treat symptoms, no other aggravating or alleviating factors.  No history of diabetes.  The history is provided by the patient and the spouse.    Past Medical History:  Diagnosis Date  . Back problem   . Chronic shoulder pain   . Hypertension   . Seizures [facility])     Patient Active Problem List   Diagnosis Date Noted  . Concussion with loss of consciousness 06/14/2015  . Chronic headache 06/14/2015  . Chest pain 04/14/2014  . Seizure disorder ([place]) 04/14/2014    History reviewed. No pertinent surgical history.   OB History   None      Home Medications    Prior to Admission medications   Medication Sig Start Date End Date Taking? Authorizing Provider  clonazePAM (KLONOPIN) 1 MG tablet Take 1 mg by mouth 3 (three) times daily.    [provider]  divalproex (DEPAKOTE ER) 500 MG 24 hr tablet Take 2 tablets (1,000 mg total) by mouth at bedtime. 06/14/15   Marcial Pacas, MD  doxepin (SINEQUAN) 25 MG capsule Take 1 capsule (25 mg  total) by mouth at bedtime. 06/21/14   Ripley Fraise, MD  QUEtiapine (SEROQUEL) 100 MG tablet Take 1 tablet (100 mg total) by mouth at bedtime. 06/14/15   Marcial Pacas, MD  rizatriptan (MAXALT-MLT) 10 MG disintegrating tablet Take 1 tablet (10 mg total) by mouth as needed. May repeat in 2 hours if needed 06/14/15   Marcial Pacas, MD    Family History Family History  Problem Relation Age of Onset  . Depression Mother   . Post-traumatic stress disorder Mother   . Mesothelioma Father     Social History Social History   Tobacco Use  . Smoking status: Current Every Day Smoker    Packs/day: 0.50    Types: Cigarettes    Start date: 09/04/2007  . Smokeless tobacco: Never Used  Substance Use Topics  . Alcohol use: No  . Drug use: No     Allergies   Tramadol; Gabapentin; Naproxen; Penicillins; Vicodin [hydrocodone-acetaminophen]; and Toradol [ketorolac tromethamine]   Review of Systems Review of Systems  Constitutional: Negative for chills and fever.  Gastrointestinal: Negative for nausea and vomiting.  Musculoskeletal: Positive for arthralgias and joint swelling.  Skin: Positive for color change.  Neurological: Positive for weakness and numbness.  All other systems reviewed and are negative.    Physical Exam Updated Vital Signs BP 110/82 (BP Location: Right  Arm)   Pulse 93   Temp 98.1 F (36.7 C) (Oral)   Resp 18   SpO2 97%   Physical Exam  Constitutional: She appears well-developed and well-nourished. No distress.  HENT:  Head: Normocephalic and atraumatic.  Eyes: Right eye exhibits no discharge. Left eye exhibits no discharge.  Pulmonary/Chest: Effort normal. No respiratory distress.  Musculoskeletal:  Palmar surface of the left hand is erythematous and mildly warm to the touch, there is mild swelling noted primarily over the palmar surface but a small amount of swelling on the back of the hand as well, erythema does not extend onto the wrist or up the arm.  2+ radial  pulse and good capillary refill.  Patient reports numbness diffusely over the palmar surface and is unable to move her fingers, they are easily moved with passive range of motion with some discomfort.  No obvious bite wound from spider noted on palm.  Neurological: She is alert. Coordination normal.  Skin: Skin is warm and dry. Capillary refill takes less than 2 seconds. She is not diaphoretic.  Psychiatric: She has a normal mood and affect. Her behavior is normal.  Nursing note and vitals reviewed.      ED Treatments / Results  Labs (all labs ordered are listed, but only abnormal results are displayed) Labs Reviewed  BASIC METABOLIC PANEL - Abnormal; Notable for the following components:      Result Value   Potassium 3.1 (*)    All other components within normal limits  CBC WITH DIFFERENTIAL/PLATELET    EKG None  Radiology No results found.  Procedures Procedures (including critical care time)  Medications Ordered in ED Medications  potassium chloride SA (K-DUR,KLOR-CON) CR tablet 40 mEq (has no administration in time range)  clindamycin (CLEOCIN) IVPB 600 mg (600 mg Intravenous New Bag/Given 04/01/18 2230)     Initial Impression / Assessment and Plan / ED Course  I have reviewed the triage vital signs and the nursing notes.  Pertinent labs & imaging results that were available during my care of the patient were reviewed by me and considered in my medical decision making (see chart for details).  Presents with reported spider bite to the left palm now developing redness and swelling.  Patient reports numbness on exam and is unable to move the hand initially.  She does endorse some pain and tightness feeling in the hand.  Erythema and swelling does not appear to be progressing past the hand.  2+ radial pulse.  No fevers.  No injury or trauma to the hand.  Despite multiple evaluations patient will not move the fingers or palm of the hand and reports numbness to touch. Case  discussed with Dr. Darl Householder who recommends basic labs and dose of IV clindamycin.  On reevaluation after IV started patient reports return of sensation to her hand and full range of motion of all fingers without difficulty.  She reports the hand just feels tight and swollen.  Labs overall reassuring, no leukocytosis and normal hemoglobin, mild hypokalemia of 3.1 replaced with oral potassium and patient to have this rechecked with her PCP, no other acute electrolyte derangements.  We will continue patient on p.o. Clinda with close follow-up in 2 days for check with her PCP to ensure cellulitis and swelling is improving.   At this time there does not appear to be any evidence of an acute emergency medical condition and the patient appears stable for discharge with appropriate outpatient follow up.Diagnosis was discussed with patient who  verbalizes understanding and is agreeable to discharge. Pt case discussed with Dr. Darl Householder who agrees with my plan.    Final Clinical Impressions(s) / ED Diagnoses   Final diagnoses:  Insect bite of left hand, initial encounter  Cellulitis of left upper extremity  Hypokalemia    ED Discharge Orders         Ordered    clindamycin (CLEOCIN) 300 MG capsule  4 times daily     04/01/18 2318           Jacqlyn Larsen, PA-C 04/02/18 0016    Drenda Freeze, MD 04/04/18 2215

## 2018-04-01 NOTE — ED Provider Notes (Signed)
Patient placed in Quick Look pathway, seen and evaluated   Chief Complaint: spider bite  HPI:   Janet Cohen is a 52 y.o. female who presents to the ED with left hand pain and swelling s/p spider bite. Patient reports that earlier today she saw a brown spider on her hand and felt a little pinch but didn't think a lot about it until later today when she began having numbness in her left hand.   ROS: skin: insect bite, swelling  Neuro: numbness left hand  Physical Exam:  .BP 110/82 (BP Location: Right Arm)   Pulse 93   Temp 98.1 F (36.7 C) (Oral)   Resp 18   SpO2 97%    Gen: No distress  Neuro: Awake and Alert  Skin: mild erythema to the palmar aspect of the left hand. Patient reports she can not touch her fingers to her thumb and she can not grip anything due to the numbness.    Initiation of care has begun. The patient has been counseled on the process, plan, and necessity for staying for the completion/evaluation, and the remainder of the medical screening examination    Ashley Murrain, NP 04/01/18 2058    Quintella Reichert, MD 04/02/18 [phone]

## 2018-04-01 NOTE — Discharge Instructions (Addendum)
Symptoms are likely due to bacterial infection from spider bite.  Please take antibiotics as directed and monitor hand closely for improvement in redness and swelling.  I would like free to follow-up with your primary care doctor in 2 to 3 days for recheck.  If redness and swelling worsens and proceeds up the arm or you develop fevers or any other new or concerning symptoms please return to the emergency department for reevaluation.  Your potassium was slightly low today, this was replaced here in the emergency department but please have this rechecked with your primary care doctor in the next few days.

## 2018-04-01 NOTE — ED Triage Notes (Signed)
Pt reports a brown spider bit the palm of her L hand around 1pm today.  Reports redness and swelling to hand and numbness to fingers.

## 2018-08-19 ENCOUNTER — Encounter: Payer: Self-pay | Admitting: Obstetrics and Gynecology

## 2018-08-19 ENCOUNTER — Ambulatory Visit: Payer: BLUE CROSS/BLUE SHIELD | Admitting: Obstetrics and Gynecology

## 2018-08-19 ENCOUNTER — Other Ambulatory Visit: Payer: Self-pay

## 2018-08-19 ENCOUNTER — Other Ambulatory Visit (HOSPITAL_COMMUNITY)
Admission: RE | Admit: 2018-08-19 | Discharge: 2018-08-19 | Disposition: A | Discharge status: Home / Self Care | Payer: BLUE CROSS/BLUE SHIELD | Source: Ambulatory Visit | Attending: Obstetrics and Gynecology | Admitting: Obstetrics and Gynecology

## 2018-08-19 VITALS — BP 118/78 | HR 80 | Resp 16 | Ht 66.25 in | Wt 124.0 lb

## 2018-08-19 DIAGNOSIS — Z01419 Encounter for gynecological examination (general) (routine) without abnormal findings: Secondary | ICD-10-CM | POA: Insufficient documentation

## 2018-08-19 DIAGNOSIS — Z113 Encounter for screening for infections with a predominantly sexual mode of transmission: Secondary | ICD-10-CM

## 2018-08-19 DIAGNOSIS — N939 Abnormal uterine and vaginal bleeding, unspecified: Secondary | ICD-10-CM

## 2018-08-19 NOTE — Patient Instructions (Signed)
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
## 2018-08-20 LAB — FOLLICLE STIMULATING HORMONE: FSH: 13.6 m[IU]/mL

## 2018-08-20 LAB — HEP, RPR, HIV PANEL
HEP B S AG: NEGATIVE
HIV SCREEN 4TH GENERATION: NONREACTIVE
RPR: NONREACTIVE

## 2018-08-20 LAB — CBC
HEMOGLOBIN: 12 g/dL (ref 11.1–15.9)
Hematocrit: 37.7 % (ref 34.0–46.6)
MCH: 26.5 pg — AB (ref 26.6–33.0)
MCHC: 31.8 g/dL (ref 31.5–35.7)
MCV: 83 fL (ref 79–97)
Platelets: 334 10*3/uL (ref 150–450)
RBC: 4.53 x10E6/uL (ref 3.77–5.28)
RDW: 13.3 % (ref 11.7–15.4)
WBC: 7.4 10*3/uL (ref 3.4–10.8)

## 2018-08-20 LAB — HEPATITIS C ANTIBODY: Hep C Virus Ab: <0.1 s/co ratio (ref 0.0–0.9)

## 2018-08-20 LAB — TSH: TSH: 1.11 u[IU]/mL (ref 0.450–4.500)

## 2018-08-20 LAB — ESTRADIOL: Estradiol: 52.2 pg/mL

## 2018-08-21 ENCOUNTER — Telehealth: Payer: Self-pay | Admitting: Obstetrics and Gynecology

## 2018-08-21 LAB — CYTOLOGY - PAP
CHLAMYDIA, DNA PROBE: NEGATIVE
Diagnosis: NEGATIVE
HPV: NOT DETECTED
NEISSERIA GONORRHEA: NEGATIVE
Trichomonas: NEGATIVE

## 2018-08-21 NOTE — Telephone Encounter (Signed)
Patient is asking if her results are in for her appointment yesterday? She stated she is "worried to death and did not not sleep last night". She is very anxious to get these results.

## 2018-08-21 NOTE — Telephone Encounter (Signed)
Spoke with patient. Patient requesting results from 08/19/18 AEX. Advised patient all results not back, Dr. Quincy Simmonds will review once all results final and our office will notify.Advised can take up to 7 days for pap.  Patient requesting results that are available.   Advised patient Hep B, Hep C, RPR, HIV negative. TSH normal.   Patient would like Dr. Quincy Simmonds to advise if she needs to continue iron and B12 supplements. Advised I will forward to Dr. Quincy Simmonds for review, our office will return call once results reviewed and Dr. Quincy Simmonds makes recommendations. Patient thankful and verbalizes understanding.   Routing to Dr. Quincy Simmonds

## 2018-08-24 NOTE — Telephone Encounter (Signed)
Left message to call Sharee Pimple, RN at [facility].      Results to patient via [facility]. Viewed by Buford Dresser on 08/23/2018 7:31 PM

## 2018-08-24 NOTE — Telephone Encounter (Signed)
Her hemoglobin is normal, so she could probably stop her iron.  I did not measure her B12 level, so I am unable to advise her about this.

## 2018-08-24 NOTE — Telephone Encounter (Signed)
Spoke with patient, advised as seen below per Dr. Quincy Simmonds.   Patient states she passed 2 clots on 1/19, still bleeding. Changing pad 3 times per day. Denies any other symptoms. Patient is scheduled for PUS on 1/23 at 10:30am. Advised patient to keep PUS as scheduled for further evaluation. If bleeding increases, changing saturated pad q1-2 hrs, return call to office. Patient verbalizes understanding.  Routing to provider for final review. Patient is agreeable to disposition. Will close encounter.

## 2018-08-27 ENCOUNTER — Ambulatory Visit: Payer: BLUE CROSS/BLUE SHIELD | Admitting: Obstetrics and Gynecology

## 2018-08-27 ENCOUNTER — Other Ambulatory Visit: Payer: Self-pay

## 2018-08-27 ENCOUNTER — Encounter: Payer: Self-pay | Admitting: Obstetrics and Gynecology

## 2018-08-27 ENCOUNTER — Ambulatory Visit (INDEPENDENT_AMBULATORY_CARE_PROVIDER_SITE_OTHER): Payer: BLUE CROSS/BLUE SHIELD

## 2018-08-27 VITALS — BP 100/64 | HR 76 | Ht 66.25 in | Wt 128.6 lb

## 2018-08-27 DIAGNOSIS — Z862 Personal history of diseases of the blood and blood-forming organs and certain disorders involving the immune mechanism: Secondary | ICD-10-CM | POA: Diagnosis not present

## 2018-08-27 DIAGNOSIS — R102 Pelvic and perineal pain: Secondary | ICD-10-CM

## 2018-08-27 DIAGNOSIS — D219 Benign neoplasm of connective and other soft tissue, unspecified: Secondary | ICD-10-CM

## 2018-08-27 DIAGNOSIS — N939 Abnormal uterine and vaginal bleeding, unspecified: Secondary | ICD-10-CM
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
## 2018-08-28 LAB — FERRITIN: Ferritin: 41 ng/mL (ref 15–150)

## 2018-08-28 LAB — IRON: Iron: 25 ug/dL — ABNORMAL LOW (ref 27–159)

## 2018-08-31 ENCOUNTER — Telehealth: Payer: Self-pay | Admitting: Obstetrics and Gynecology

## 2018-08-31 ENCOUNTER — Other Ambulatory Visit: Payer: Self-pay | Admitting: Obstetrics and Gynecology

## 2018-08-31 DIAGNOSIS — Z1231 Encounter for screening mammogram for malignant neoplasm of breast: Secondary | ICD-10-CM

## 2018-08-31 NOTE — Telephone Encounter (Signed)
Spoke with patient, calling to schedule EMB for AUB. Patient reports she is still bleeding, changing pad q4-5 hrs, denies heavy bleeding. EMB scheduled for 1/28 at 11am with Dr. Quincy Simmonds. Advised to take Motrin 800 mg with food and water one hour before procedure.  Order previously placed for precert.   Routing to provider for final review. Patient is agreeable to disposition. Will close encounter.  Cc: Magdalene Patricia, Thayer Ohm

## 2018-08-31 NOTE — Telephone Encounter (Signed)
Patient calling to schedule appointment for biopsy.

## 2018-09-01 ENCOUNTER — Ambulatory Visit: Payer: BLUE CROSS/BLUE SHIELD | Admitting: Obstetrics and Gynecology

## 2018-09-01 ENCOUNTER — Encounter: Payer: Self-pay | Admitting: Obstetrics and Gynecology

## 2018-09-01 ENCOUNTER — Ambulatory Visit
Admission: RE | Admit: 2018-09-01 | Discharge: 2018-09-01 | Disposition: A | Discharge status: Home / Self Care | Payer: BLUE CROSS/BLUE SHIELD | Source: Ambulatory Visit | Attending: Obstetrics and Gynecology | Admitting: Obstetrics and Gynecology

## 2018-09-01 DIAGNOSIS — N939 Abnormal uterine and vaginal bleeding, unspecified: Secondary | ICD-10-CM | POA: Diagnosis not present

## 2018-09-01 DIAGNOSIS — Z1231 Encounter for screening mammogram for malignant neoplasm of breast: Secondary | ICD-10-CM
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
## 2018-09-01 NOTE — Patient Instructions (Signed)

## 2018-09-03 ENCOUNTER — Telehealth: Payer: Self-pay | Admitting: Obstetrics and Gynecology

## 2018-09-03 NOTE — Telephone Encounter (Signed)
Reviewed benefits for recommended surgery. Patient understands and is agreeable. NO PRE CERT IS REQUIRED. Patient to call back tomorrow 09/04/18 to confirm and move forward with scheduling.

## 2018-09-03 NOTE — Telephone Encounter (Signed)
Patient called requesting to speak with Gay Filler to confirm a surgery date of 09/22/2018. She said she has 4 jobs to coordinate with.

## 2018-09-04 NOTE — Telephone Encounter (Signed)
Patient called and paid her deposit for surgery today.  Cc: Janet Cohen/Janet Cohen

## 2018-09-07 ENCOUNTER — Telehealth: Payer: Self-pay

## 2018-09-07 NOTE — Telephone Encounter (Signed)
Called patient and left message to return my call. 

## 2018-09-07 NOTE — Telephone Encounter (Signed)
Patient is now scheduled for surgery in 2 weeks.  She needs to complete her mammogram.

## 2018-09-07 NOTE — Telephone Encounter (Signed)
Surgery scheduled for Tuesday, 09-22-2018 at [facility]. Call to patient. Advised of date as requested. Surgery instructions reviewed and printed copy will be provided at consult appointment on 09-11-2018 with hospital brochure.   Routing to Dr Quincy Simmonds. Encounter closed.

## 2018-09-07 NOTE — Telephone Encounter (Signed)
-----   Message from Nunzio Cobbs, MD sent at 09/06/2018 11:06 AM EST ----- Please inform patient of her benign endometrial biopsy result. No abnormal cells were seen.   Please have her reschedule her mammogram if this was not already done.  It needs to be completed for her to proceed with hysterectomy.   I am highlighting this result so you know to contact the patient.

## 2018-09-08 NOTE — Telephone Encounter (Signed)
Call to patient. Advised Dr Quincy Simmonds recommends reschedule and completion of mammogram prior to surgery.  Patient states she had significant anxiety with mammogram due to pain. Since she had normal breast exam, is mammogram really needed?  Advised that breast exam is in conjunction with mammogram, not in place of and she still needs to complete mammogram before surgery.  Patient states she will comply.  Encounter closed.

## 2018-09-11 ENCOUNTER — Encounter: Payer: Self-pay | Admitting: Obstetrics and Gynecology

## 2018-09-11 ENCOUNTER — Ambulatory Visit: Payer: BLUE CROSS/BLUE SHIELD | Admitting: Obstetrics and Gynecology

## 2018-09-11 ENCOUNTER — Other Ambulatory Visit: Payer: Self-pay

## 2018-09-11 VITALS — BP 100/62 | HR 64 | Ht 66.25 in | Wt 127.0 lb

## 2018-09-11 DIAGNOSIS — N939 Abnormal uterine and vaginal bleeding, unspecified: Secondary | ICD-10-CM

## 2018-09-11 DIAGNOSIS — N898 Other specified noninflammatory disorders of vagina: Secondary | ICD-10-CM

## 2018-09-11 DIAGNOSIS — D219 Benign neoplasm of connective and other soft tissue, unspecified: Secondary | ICD-10-CM | POA: Diagnosis not present

## 2018-09-11 DIAGNOSIS — F1190 Opioid use, unspecified, uncomplicated: Secondary | ICD-10-CM

## 2018-09-11 MED ORDER — NORETHINDRONE ACETATE 5 MG PO TABS: 5.0000 mg | ORAL_TABLET | Freq: Every day | ORAL | 0 refills | Status: DC
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
## 2018-09-12 LAB — VAGINITIS/VAGINOSIS, DNA PROBE
Candida Species: NEGATIVE
Gardnerella vaginalis: POSITIVE — AB
Trichomonas vaginosis: NEGATIVE

## 2018-09-13 NOTE — H&P (Signed)
Office Visit   09/11/2018 [place] Silva, Everardo All, MD  Obstetrics and Gynecology   Abnormal uterine bleeding +3 more  Dx   Surgical Consult   ; Referred by Lin Landsman, MD  Reason for Visit   Additional Documentation   Vitals:   BP 100/62 (BP Location: Right Arm, Patient Position: Sitting, Cuff Size: Normal)   Pulse 64   Ht 5' 6.25" (1.683 m)   Wt 57.6 kg   BMI 20.34 kg/m   BSA 1.64 m   Flowsheets:   MEWS Score,   Anthropometrics     Encounter Info:   Billing Info,   History,   Allergies,   Detailed Report     All Notes   Progress Notes by Nunzio Cobbs, MD at 09/11/2018 3:30 PM  Author: Nunzio Cobbs, MD Author Type: Physician Filed: 09/13/2018 10:39 AM  Note Status: Signed Cosign: Cosign Not Required Encounter Date: 09/11/2018  Editor: Nunzio Cobbs, MD (Physician)  Prior Versions: 1. Lowella Fairy, CMA (Certified Psychologist, sport and exercise) at 09/11/2018 3:30 PM - Sign when Signing Visit    GYNECOLOGY  VISIT   HPI: 53 y.o.   Married  [place] American  female   [phone] with No LMP recorded. (Menstrual status: Irregular Periods).   here for surgical consult.   She desires hysterectomy and declines future childbearing.  Had vaginal bleeding yesterday.  Bleeding almost every day.  She put a transdermal estrogen patch on that she received from her PCP, despite the provider recommending to stop this.  States she just wants the bleeding to stop.   Patient has abnormal uterine bleeding, pelvic pain.  US shows fibroids, including a submucosal fibroid, suspected adenomyosis, and normal ovaries. EMB showed benign inactive endometrium.  Hgb 12.0 on 08/19/18.  TSH normal on 08/19/18.   Takes 2 - 3 Percocet per day due to back pain and chronic arm pain.  Lifting prompts this.   She has an eating disorder and consumes only Drinks Dr. Malachi Bonds, saltines, Ensure, gingerale, chicken noodle soup.    Reports she  sometimes goes out to eat with her husband and will eat meat and potatoes. States she weighed 500 pounds when she got out of prison, and then she just starved herself to loose weight.   GYNECOLOGIC HISTORY: No LMP recorded. (Menstrual status: Irregular Periods). Contraception: None Menopausal hormone therapy:  none Last mammogram:  Never--appt. 09-17-18 Last pap smear:  08-19-18 Neg:Neg HR HPV                OB History    Gravida  3   Para  2   Term  0   Preterm  0   AB  1   Living  2     SAB  1   TAB  0   Ectopic  0   Multiple  0   Live Births  0                  Patient Active Problem List   Diagnosis Date Noted  . Concussion with loss of consciousness 06/14/2015  . Chronic headache 06/14/2015  . Chest pain 04/14/2014  . Seizure disorder ([place]) 04/14/2014        Past Medical History:  Diagnosis Date  . Anxiety   . Back problem   . Chronic shoulder pain   . Depression   . DVT (deep venous thrombosis) ([place]) 2016  related to injury with a tractor.  had left leg trauma.  . Eating disorder   . Hx of obesity   . Hypertension   . Seizures ([place])     History reviewed. No pertinent surgical history.        Current Outpatient Medications  Medication Sig Dispense Refill  . alprazolam (XANAX) 2 MG tablet Take 1 tablet by mouth as needed.    . clonazePAM (KLONOPIN) 1 MG tablet Take 1 mg by mouth 3 (three) times daily.    . cyanocobalamin (,VITAMIN B-12,) 1000 MCG/ML injection INJECT 1 MILLILITER (1,000 MCG) BY INTRAMUSCULAR ROUTE ONCE A MONTH FOR 30 DAYS    . divalproex (DEPAKOTE ER) 500 MG 24 hr tablet Take 2 tablets (1,000 mg total) by mouth at bedtime. 60 tablet 11  . doxepin (SINEQUAN) 25 MG capsule Take 1 capsule (25 mg total) by mouth at bedtime. 14 capsule 0  . lisinopril (PRINIVIL,ZESTRIL) 20 MG tablet Take 20 mg by mouth daily.    . Multiple Vitamins-Minerals (MULTIVITAMIN ADULT PO) Take by mouth.    .  oxyCODONE-acetaminophen (PERCOCET) 10-325 MG tablet TAKE 1 TABLET BY MOUTH EVERY 4 TO 6 HOURS    . QUEtiapine (SEROQUEL) 100 MG tablet Take 1 tablet (100 mg total) by mouth at bedtime. 30 tablet 6  . rizatriptan (MAXALT-MLT) 10 MG disintegrating tablet Take 1 tablet (10 mg total) by mouth as needed. May repeat in 2 hours if needed 15 tablet 6  . venlafaxine XR (EFFEXOR-XR) 75 MG 24 hr capsule Take by mouth daily.    Marland Kitchen zolpidem (AMBIEN) 10 MG tablet Take by mouth as needed.    . norethindrone (AYGESTIN) 5 MG tablet Take 1 tablet (5 mg total) by mouth daily. 11 tablet 0   No current facility-administered medications for this visit.      ALLERGIES: Tramadol; Bee venom; Gabapentin; Naproxen; Penicillins; Vicodin [hydrocodone-acetaminophen]; and Toradol [ketorolac tromethamine]       Family History  Problem Relation Age of Onset  . Depression Mother   . Post-traumatic stress disorder Mother   . Mesothelioma Father   . Cancer Sister        cervical cancer 2010  . Breast cancer Maternal Aunt 40       breast cancer    Social History        Socioeconomic History  . Marital status: Married    Spouse name: Not on file  . Number of children: 2  . Years of education: Masters  . Highest education level: Not on file  Occupational History  . Occupation: [employer]  . Financial resource strain: Not on file  . Food insecurity:    Worry: Not on file    Inability: Not on file  . Transportation needs:    Medical: Not on file    Non-medical: Not on file  Tobacco Use  . Smoking status: Current Every Day Smoker    Packs/day: 1.00    Types: Cigarettes    Start date: 09/04/2007  . Smokeless tobacco: Never Used  Substance and Sexual Activity  . Alcohol use: No  . Drug use: Never  . Sexual activity: Not Currently    Birth control/protection: None  Lifestyle  . Physical activity:    Days per week: Not on file    Minutes per session: Not on  file  . Stress: Not on file  Relationships  . Social connections:    Talks on phone: Not on file    Gets together: Not  on file    Attends religious service: Not on file    Active member of club or organization: Not on file    Attends meetings of clubs or organizations: Not on file    Relationship status: Not on file  . Intimate partner violence:    Fear of current or ex partner: Not on file    Emotionally abused: Not on file    Physically abused: Not on file    Forced sexual activity: Not on file  Other Topics Concern  . Not on file  Social History Narrative   Lives at home with husband.   Right-handed.   No caffeine use.    Review of Systems  All other systems reviewed and are negative.   PHYSICAL EXAMINATION:    BP 100/62 (BP Location: Right Arm, Patient Position: Sitting, Cuff Size: Normal)   Pulse 64   Ht 5' 6.25" (1.683 m)   Wt 127 lb (57.6 kg)   BMI 20.34 kg/m     General appearance: alert, cooperative and appears stated age Head: Normocephalic, without obvious abnormality, atraumatic Neck: no adenopathy, supple, symmetrical, trachea midline and thyroid normal to inspection and palpation Lungs: clear to auscultation bilaterally Breasts: normal appearance, no masses or tenderness, No nipple retraction or dimpling, No nipple discharge or bleeding, No axillary or supraclavicular adenopathy Heart: regular rate and rhythm Abdomen: soft, non-tender, no masses,  no organomegaly Extremities: extremities normal, atraumatic, no cyanosis or edema Skin: Skin color, texture, turgor normal. No rashes or lesions.  Minimal excess skin. Lymph nodes: Cervical, supraclavicular, and axillary nodes normal. No abnormal inguinal nodes palpated Neurologic: Grossly normal  Pelvic: External genitalia:  no lesions              Urethra:  normal appearing urethra with no masses, tenderness or lesions              Bartholins and Skenes: normal                  Vagina: normal appearing vagina with normal color and large amount of yellow discharge, no lesions              Cervix: no lesions                Bimanual Exam:  Uterus:  normal size, contour, position, consistency, mobility, non-tender              Adnexa: no mass, fullness, tenderness              Rectal exam: Yes.  .  Confirms.              Anus:  normal sphincter tone, no lesions  Chaperone was present for exam.  ASSESSMENT  Abnormal uterine bleeding.  Small fibroids and suspected adenomyosis.  Intrauterine mass most consistent with submucous fibroid.  Chronic pain following tractor accident.  Chronic narcotic use.  Hx DVT from leg injury. Not an estrogen candidate. Hx seizure disorder.  Smoker.   PLAN  I discussed total laparoscopic hysterectomy with bilateral salpingectomy and possible bilateral oophorectomy, cystoscopy.   Will retain her ovaries if normal. I reviewed risks, benefits, and alternatives.  Risks include but are not limited to bleeding, infection, damage to surrounding organs, pneumonia, reaction to anesthesia, DVT, PE, death,hernia formation, vaginal cuff dehiscence, neuropathy, continued pain, need to convert to a traditional laparotomy incision to complete the procedure, and menopausal symptoms with increased risk of cardiovascular disease and osteoporosis if ovaries are removed.  We discussed vaginal morcellation of specimen in an Alexis bag if needed.  Surgical expectations and recovery discussed.  She understands no work, chores, or exercise for a minimal of 6 weeks. Questions invited and answered. Patient wishes to proceed.  She will do a magnesium citrate bowel prep.  She will receive Lovenox for DVT/PE prophylaxis for 2 weeks post op.  Magnesium citrate bowel prep.  I suspect she will need Dilaudid for her post op prescription for narcotic.  Aygestin 5 mg daily until surgery. Stop Transdermal estrogen.  I told her this increases her risk of  thromboembolic events. I recommended she stop smoking.  Affirm.  Will move up mammogram appt.    An After Visit Summary was printed and given to the patient.  __25____ minutes face to face time of which over 50% was spent in counseling.

## 2018-09-14 ENCOUNTER — Telehealth: Payer: Self-pay | Admitting: *Deleted

## 2018-09-14 MED ORDER — METRONIDAZOLE 0.75 % VA GEL: 1.0000 | Freq: Every day | VAGINAL | 0 refills | Status: AC

## 2018-09-14 NOTE — Telephone Encounter (Signed)
Spoke with patient, advised as seen below per Dr. Quincy Simmonds. Rx for metrogel to verified pharmacy. Patient will plan to start Rx tonight. Patient verbalizes understanding and is agreeable.   Encounter closed.

## 2018-09-14 NOTE — Telephone Encounter (Signed)
Notes recorded by Burnice Logan, RN on 09/14/2018 at 3:46 PM EST Left message to call Sharee Pimple, RN at [facility].

## 2018-09-14 NOTE — Telephone Encounter (Signed)
-----   Message from Nunzio Cobbs, MD sent at 09/13/2018 11:21 AM EST ----- Please inform of Affirm result showing bacterial vaginosis. Metrogel pv at hs x 5 nights. (She has a difficult time eating, so I am concerned that an oral option is not the best choice.) Please send Rx to pharmacy of choice. ETOH precautions.   She needs to start this as soon as possible as she has a hysterectomy scheduled for next week, and I would like her to complete her treatment before surgery.

## 2018-09-15 ENCOUNTER — Ambulatory Visit
Admission: RE | Admit: 2018-09-15 | Discharge: 2018-09-15 | Disposition: A | Discharge status: Home / Self Care | Payer: BLUE CROSS/BLUE SHIELD | Source: Ambulatory Visit | Attending: Obstetrics and Gynecology | Admitting: Obstetrics and Gynecology

## 2018-09-15 ENCOUNTER — Telehealth: Payer: Self-pay | Admitting: Obstetrics and Gynecology

## 2018-09-15 NOTE — Telephone Encounter (Signed)
Patient called stating that she was to let Dr. Quincy Simmonds know when she completed her mammogram. Patient stated that she just completed it.  Routing to Dr. Quincy Simmonds for [place].

## 2018-09-16 NOTE — Telephone Encounter (Signed)
Encounter reviewed and closed.  

## 2018-09-16 NOTE — Patient Instructions (Addendum)
Janet Cohen  09/16/2018      Your procedure is scheduled on2-18-2020  Report to [facility].  Call this number if you have problems the morning of surgery:[phone]  OUR ADDRESS IS [place], WE ARE LOCATED IN [facility] WITH [facility].   Remember: BRING ALL PRESCRIPTION MEDICATIONS IN ORIGINAL CONTAINERS   NO SOLID FOOD AFTER MIDNIGHT THE NIGHT PRIOR TO SURGERY. NOTHING BY MOUTH EXCEPT CLEAR LIQUIDS UNTIL 3 HOURS PRIOR TO [place] SURGERY. PLEASE FINISH ENSURE DRINK PER SURGEON ORDER 3 HOURS PRIOR TO SCHEDULED SURGERY TIME WHICH NEEDS TO BE COMPLETED AT 430 AM. .  CLEAR LIQUID DIET   Foods Allowed                                                                     Foods Excluded  Coffee and tea, regular and decaf                             liquids that you cannot  Plain Jell-O in any flavor                                             see through such as: Fruit ices (not with fruit pulp)                                     milk, soups, orange juice  Iced Popsicles                                    All solid food Carbonated beverages, regular and diet                                    Cranberry, grape and apple juices Sports drinks like Gatorade Lightly seasoned clear broth or consume(fat free) Sugar, honey syrup  Sample Menu Breakfast                                Lunch                                     Supper Cranberry juice                    Beef broth                            Chicken broth Jell-O  Grape juice                           Apple juice Coffee or tea                        Jell-O                                      Popsicle                                                Coffee or tea                        Coffee or tea  _____________________________________________________________________   Take these medicines the morning of surgery with A  SIP OF WATER : ALPRAZOLAM IF NEEDED, KLONOPIN IF NEEDED, VENLAFAXINE (EFFEXOR) DILANTIN   Do not wear jewelry, make-up or nail polish.  Do not wear lotions, powders, or perfumes, or deoderant.  Do not shave 48 hours prior to surgery.  Men may shave face and neck.  Do not bring valuables to the hospital.  [facility] is not responsible for any belongings or valuables.  Contacts, dentures or bridgework may not be worn into surgery.  Leave your suitcase in the car.  After surgery it may be brought to your room.  For patients admitted to the hospital, discharge time will be determined by your treatment team.  Patients discharged the day of surgery will not be allowed to drive home.   Special instructions:  Please read over the following fact sheets that you were given:   [facility] - Preparing for Surgery Before surgery, you can play an important role.  Because skin is not sterile, your skin needs to be as free of germs as possible.  You can reduce the number of germs on your skin by washing with CHG (chlorahexidine gluconate) soap before surgery.  CHG is an antiseptic cleaner which kills germs and bonds with the skin to continue killing germs even after washing. Please DO NOT use if you have an allergy to CHG or antibacterial soaps.  If your skin becomes reddened/irritated stop using the CHG and inform your nurse when you arrive at Short Stay. Do not shave (including legs and underarms) for at least 48 hours prior to the first CHG shower.  You may shave your face/neck. Please follow these instructions carefully:  1.  Shower with CHG Soap the night before surgery and the  morning of Surgery.  2.  If you choose to wash your hair, wash your hair first as usual with your  normal  shampoo.  3.  After you shampoo, rinse your hair and body thoroughly to remove the  shampoo.                           4.  Use CHG as you would any other liquid soap.  You can apply chg directly  to the skin and wash                        Gently with a scrungie or clean washcloth.  5.  Apply the CHG Soap to your body ONLY FROM THE NECK DOWN.   Do not use on face/ open                           Wound or open sores. Avoid contact with eyes, ears mouth and genitals (private parts).                       Wash face,  Genitals (private parts) with your normal soap.             6.  Wash thoroughly, paying special attention to the area where your surgery  will be performed.  7.  Thoroughly rinse your body with warm water from the neck down.  8.  DO NOT shower/wash with your normal soap after using and rinsing off  the CHG Soap.                9.  Pat yourself dry with a clean towel.            10.  Wear clean pajamas.            11.  Place clean sheets on your bed the night of your first shower and do not  sleep with pets. Day of Surgery : Do not apply any lotions/deodorants the morning of surgery.  Please wear clean clothes to the hospital/surgery center.  FAILURE TO FOLLOW THESE INSTRUCTIONS MAY RESULT IN THE CANCELLATION OF YOUR SURGERY PATIENT SIGNATURE_________________________________  NURSE SIGNATURE__________________________________  ________________________________________________________________________  WHAT IS A BLOOD TRANSFUSION? Blood Transfusion Information  A transfusion is the replacement of blood or some of its parts. Blood is made up of multiple cells which provide different functions.  Red blood cells carry oxygen and are used for blood loss replacement.  White blood cells fight against infection.  Platelets control bleeding.  Plasma helps clot blood.  Other blood products are available for specialized needs, such as hemophilia or other clotting disorders. BEFORE THE TRANSFUSION  Who gives blood for transfusions?   Healthy volunteers who are fully evaluated to make sure their blood is safe. This is blood bank blood. Transfusion therapy is the safest it has ever been in the practice of  medicine. Before blood is taken from a donor, a complete history is taken to make sure that person has no history of diseases nor engages in risky social behavior (examples are intravenous drug use or sexual activity with multiple partners). The donor's travel history is screened to minimize risk of transmitting infections, such as malaria. The donated blood is tested for signs of infectious diseases, such as HIV and hepatitis. The blood is then tested to be sure it is compatible with you in order to minimize the chance of a transfusion reaction. If you or a relative donates blood, this is often done in anticipation of surgery and is not appropriate for emergency situations. It takes many days to process the donated blood. RISKS AND COMPLICATIONS Although transfusion therapy is very safe and saves many lives, the main dangers of transfusion include:   Getting an infectious disease.  Developing a transfusion reaction. This is an allergic reaction to something in the blood you were given. Every precaution is taken to prevent this. The decision to have a blood transfusion has been considered carefully by your caregiver before blood is given. Blood is not given unless the benefits outweigh the risks. AFTER THE TRANSFUSION  Right after  receiving a blood transfusion, you will usually feel much better and more energetic. This is especially true if your red blood cells have gotten low (anemic). The transfusion raises the level of the red blood cells which carry oxygen, and this usually causes an energy increase.  The nurse administering the transfusion will monitor you carefully for complications. HOME CARE INSTRUCTIONS  No special instructions are needed after a transfusion. You may find your energy is better. Speak with your caregiver about any limitations on activity for underlying diseases you may have. SEEK MEDICAL CARE IF:   Your condition is not improving after your transfusion.  You develop  redness or irritation at the intravenous (IV) site. SEEK IMMEDIATE MEDICAL CARE IF:  Any of the following symptoms occur over the next 12 hours:  Shaking chills.  You have a temperature by mouth above 102 F (38.9 C), not controlled by medicine.  Chest, back, or muscle pain.  People around you feel you are not acting correctly or are confused.  Shortness of breath or difficulty breathing.  Dizziness and fainting.  You get a rash or develop hives.  You have a decrease in urine output.  Your urine turns a dark color or changes to pink, red, or brown. Any of the following symptoms occur over the next 10 days:  You have a temperature by mouth above 102 F (38.9 C), not controlled by medicine.  Shortness of breath.  Weakness after normal activity.  The white part of the eye turns yellow (jaundice).  You have a decrease in the amount of urine or are urinating less often.  Your urine turns a dark color or changes to pink, red, or brown. Document Released: 07/19/2000 Document Revised: 10/14/2011 Document Reviewed: 03/07/2008 [facility] Patient Information 2014 [place], [place].  _______________________________________________________________________

## 2018-09-21 ENCOUNTER — Encounter (HOSPITAL_COMMUNITY): Payer: Self-pay

## 2018-09-21 ENCOUNTER — Other Ambulatory Visit: Payer: Self-pay

## 2018-09-21 ENCOUNTER — Encounter (HOSPITAL_COMMUNITY)
Admission: RE | Admit: 2018-09-21 | Discharge: 2018-09-21 | Disposition: A | Discharge status: Home / Self Care | Payer: BLUE CROSS/BLUE SHIELD | Source: Ambulatory Visit | Attending: Obstetrics and Gynecology | Admitting: Obstetrics and Gynecology

## 2018-09-21 DIAGNOSIS — I10 Essential (primary) hypertension: Secondary | ICD-10-CM | POA: Insufficient documentation

## 2018-09-21 DIAGNOSIS — Z01818 Encounter for other preprocedural examination: Secondary | ICD-10-CM | POA: Insufficient documentation

## 2018-09-21 HISTORY — DX: Anemia, unspecified: D64.9

## 2018-09-21 LAB — ABO/RH: ABO/RH(D): O POS

## 2018-09-21 LAB — CBC
HEMATOCRIT: 38 % (ref 36.0–46.0)
Hemoglobin: 11.6 g/dL — ABNORMAL LOW (ref 12.0–15.0)
MCH: 26.1 pg (ref 26.0–34.0)
MCHC: 30.5 g/dL (ref 30.0–36.0)
MCV: 85.6 fL (ref 80.0–100.0)
Platelets: 314 10*3/uL (ref 150–400)
RBC: 4.44 MIL/uL (ref 3.87–5.11)
RDW: 14.7 % (ref 11.5–15.5)
WBC: 6 10*3/uL (ref 4.0–10.5)
nRBC: 0 % (ref 0.0–0.2)

## 2018-09-21 LAB — BASIC METABOLIC PANEL
Anion gap: 6 (ref 5–15)
BUN: <5 mg/dL — ABNORMAL LOW (ref 6–20)
CO2: 29 mmol/L (ref 22–32)
Calcium: 9.1 mg/dL (ref 8.9–10.3)
Chloride: 107 mmol/L (ref 98–111)
Creatinine, Ser: 0.78 mg/dL (ref 0.44–1.00)
GFR calc Af Amer: >60 mL/min (ref >60)
GFR calc non Af Amer: >60 mL/min (ref >60)
GLUCOSE: 95 mg/dL (ref 70–99)
Potassium: 3.8 mmol/L (ref 3.5–5.1)
Sodium: 142 mmol/L (ref 135–145)

## 2018-09-21 NOTE — Progress Notes (Signed)
Anesthesia Chart Review   Case:  [id number] Date/Time:  09/22/18 0730   Procedures:      TOTAL LAPAROSCOPIC HYSTERECTOMY WITH SALPINGECTOMY possible BSO (Bilateral ) - Possible BSO. Extended recovery bed needed.     CYSTOSCOPY (N/A )   Anesthesia type:  Choice   Pre-op diagnosis:  AUB, fibroids, pelvic pain   Location:  [facility] OR ROOM 1 / [place]   Surgeon:  Nunzio Cobbs, MD      DISCUSSION:53 yo current every day smoker (20 pack years) with h/o seizures (last seizure years ago), anxiety, depression, DVT (2016 s/o left leg trauma; per Dr. Elza Rafter H&P pt will receive Lovenox s/p procedure), HTN, anemia, chronic pain, disordered eating, AUB, fibroids, pelvic pain scheduled for above procedure 09/22/18 with Dr. Josefa Half.    Pt can proceed with planned procedure barring acute status change.  VS: BP 97/60 (BP Location: Left Arm)   Pulse 66   Temp 36.7 C (Oral)   Resp 18   LMP 09/17/2018   SpO2 100%   PROVIDERS: Lin Landsman, MD is PCP    LABS: Labs reviewed: Acceptable for surgery. (all labs ordered are listed, but only abnormal results are displayed)  Labs Reviewed  CBC - Abnormal; Notable for the following components:      Result Value   Hemoglobin 11.6 (*)    All other components within normal limits  BASIC METABOLIC PANEL  TYPE AND SCREEN     IMAGES:   EKG: 09/21/2018 Rate 51 bpm Sinus bradycardia  Otherwise normal ECG  CV: Echo 04/15/2013 Study Conclusions  - Left ventricle: The cavity size was normal. Wall thickness was increased in a pattern of mild LVH. Systolic function was normal. The estimated ejection fraction was in the range of 60% to 65%. Wall motion was normal; there were no regional wall motion abnormalities. Left ventricular diastolic function parameters were normal. - Aortic valve: Valve area (VTI): 1.96 cm^2. Valve area (Vmax): 2.14 cm^2. - Technically adequate study. Past Medical History:  Diagnosis  Date  . Anemia   . Anxiety   . Back problem    PAIN WHEN LIFTING  . Chronic shoulder pain    BOTH SHOULDER HAVE ROTATOR CUFF TEAR  . Depression   . DVT (deep venous thrombosis) ([place]) 2016   related to injury with a tractor.  had left leg trauma.  . Eating disorder   . Hx of obesity   . Hypertension   . Seizures ([place])    LAST SEIZURE YRS AGO    Past Surgical History:  Procedure Laterality Date  . KNEE SURGERY     RIGHT  . MULTIPLE TOOTH EXTRACTIONS      MEDICATIONS: . Phenytoin (DILANTIN PO)  . alprazolam (XANAX) 2 MG tablet  . clonazePAM (KLONOPIN) 1 MG tablet  . cyanocobalamin (,VITAMIN B-12,) 1000 MCG/ML injection  . divalproex (DEPAKOTE ER) 500 MG 24 hr tablet  . doxepin (SINEQUAN) 25 MG capsule  . lisinopril (PRINIVIL,ZESTRIL) 20 MG tablet  . megestrol (MEGACE) 40 MG tablet  . Multiple Vitamins-Minerals (MULTIVITAMIN ADULT PO)  . norethindrone (AYGESTIN) 5 MG tablet  . oxyCODONE-acetaminophen (PERCOCET) 10-325 MG tablet  . QUEtiapine (SEROQUEL) 100 MG tablet  . rizatriptan (MAXALT-MLT) 10 MG disintegrating tablet  . venlafaxine XR (EFFEXOR-XR) 75 MG 24 hr capsule  . zolpidem (AMBIEN) 10 MG tablet   No current facility-administered medications for this encounter.     Maia Plan [facility] Pre-Surgical Testing [phone] 09/21/18 4:08 PM

## 2018-09-21 NOTE — Anesthesia Preprocedure Evaluation (Addendum)
Anesthesia Evaluation  Patient identified by MRN, date of birth, ID band Patient awake    Reviewed: Allergy & Precautions, H&P , NPO status , Patient's Chart, lab work & pertinent test results  Airway Mallampati: II  TM Distance: >3 FB Neck ROM: full    Dental  (+) Dental Advisory Given, Edentulous Upper, Edentulous Lower   Pulmonary Current Smoker,    breath sounds clear to auscultation       Cardiovascular hypertension, Pt. on medications + DVT   Rhythm:regular Rate:Normal     Neuro/Psych  Headaches, Seizures -,  PSYCHIATRIC DISORDERS Anxiety Depression    GI/Hepatic   Endo/Other    Renal/GU      Musculoskeletal   Abdominal   Peds  Hematology  (+) anemia ,   Anesthesia Other Findings   Reproductive/Obstetrics                         Anesthesia Physical Anesthesia Plan  ASA: II  Anesthesia Plan: General   Post-op Pain Management:    Induction: Intravenous  PONV Risk Score and Plan: 2 and Ondansetron, Dexamethasone, Midazolam and Treatment may vary due to age or medical condition  Airway Management Planned: Oral ETT  Additional Equipment:   Intra-op Plan:   Post-operative Plan: Extubation in OR  Informed Consent: I have reviewed the patients History and Physical, chart, labs and discussed the procedure including the risks, benefits and alternatives for the proposed anesthesia with the patient or authorized representative who has indicated his/her understanding and acceptance.       Plan Discussed with: CRNA, Anesthesiologist and Surgeon  Anesthesia Plan Comments: (See PST note 09/21/18, Konrad Felix, PA-C)       Anesthesia Quick Evaluation

## 2018-09-22 ENCOUNTER — Encounter (HOSPITAL_BASED_OUTPATIENT_CLINIC_OR_DEPARTMENT_OTHER)
Admission: RE | Disposition: A | Discharge status: Home / Self Care | Payer: Self-pay | Source: Home / Self Care | Attending: Obstetrics and Gynecology

## 2018-09-22 ENCOUNTER — Observation Stay (HOSPITAL_BASED_OUTPATIENT_CLINIC_OR_DEPARTMENT_OTHER): Payer: BLUE CROSS/BLUE SHIELD | Admitting: Anesthesiology

## 2018-09-22 ENCOUNTER — Encounter (HOSPITAL_BASED_OUTPATIENT_CLINIC_OR_DEPARTMENT_OTHER): Payer: Self-pay

## 2018-09-22 ENCOUNTER — Observation Stay (HOSPITAL_BASED_OUTPATIENT_CLINIC_OR_DEPARTMENT_OTHER)
Admission: RE | Admit: 2018-09-22 | Discharge: 2018-09-23 | Disposition: A | Discharge status: Home / Self Care | Payer: BLUE CROSS/BLUE SHIELD | Attending: Obstetrics and Gynecology | Admitting: Obstetrics and Gynecology

## 2018-09-22 ENCOUNTER — Observation Stay (HOSPITAL_BASED_OUTPATIENT_CLINIC_OR_DEPARTMENT_OTHER): Payer: BLUE CROSS/BLUE SHIELD | Admitting: Physician Assistant

## 2018-09-22 DIAGNOSIS — M25519 Pain in unspecified shoulder: Secondary | ICD-10-CM | POA: Insufficient documentation

## 2018-09-22 DIAGNOSIS — R102 Pelvic and perineal pain: Secondary | ICD-10-CM | POA: Diagnosis not present

## 2018-09-22 DIAGNOSIS — F329 Major depressive disorder, single episode, unspecified: Secondary | ICD-10-CM | POA: Diagnosis not present

## 2018-09-22 DIAGNOSIS — M79603 Pain in arm, unspecified: Secondary | ICD-10-CM | POA: Diagnosis not present

## 2018-09-22 DIAGNOSIS — R509 Fever, unspecified: Secondary | ICD-10-CM | POA: Insufficient documentation

## 2018-09-22 DIAGNOSIS — Z79891 Long term (current) use of opiate analgesic: Secondary | ICD-10-CM | POA: Insufficient documentation

## 2018-09-22 DIAGNOSIS — Z79899 Other long term (current) drug therapy: Secondary | ICD-10-CM | POA: Insufficient documentation

## 2018-09-22 DIAGNOSIS — F17210 Nicotine dependence, cigarettes, uncomplicated: Secondary | ICD-10-CM | POA: Diagnosis not present

## 2018-09-22 DIAGNOSIS — G8929 Other chronic pain: Secondary | ICD-10-CM | POA: Insufficient documentation

## 2018-09-22 DIAGNOSIS — Z793 Long term (current) use of hormonal contraceptives: Secondary | ICD-10-CM | POA: Insufficient documentation

## 2018-09-22 DIAGNOSIS — Z86718 Personal history of other venous thrombosis and embolism: Secondary | ICD-10-CM | POA: Insufficient documentation

## 2018-09-22 DIAGNOSIS — F419 Anxiety disorder, unspecified: Secondary | ICD-10-CM | POA: Diagnosis not present

## 2018-09-22 DIAGNOSIS — F509 Eating disorder, unspecified: Secondary | ICD-10-CM | POA: Insufficient documentation

## 2018-09-22 DIAGNOSIS — N939 Abnormal uterine and vaginal bleeding, unspecified: Principal | ICD-10-CM | POA: Diagnosis present

## 2018-09-22 DIAGNOSIS — N800 Endometriosis of uterus: Secondary | ICD-10-CM | POA: Insufficient documentation

## 2018-09-22 DIAGNOSIS — D259 Leiomyoma of uterus, unspecified: Secondary | ICD-10-CM | POA: Diagnosis not present

## 2018-09-22 DIAGNOSIS — R569 Unspecified convulsions: Secondary | ICD-10-CM | POA: Diagnosis not present

## 2018-09-22 DIAGNOSIS — M549 Dorsalgia, unspecified: Secondary | ICD-10-CM | POA: Diagnosis not present

## 2018-09-22 DIAGNOSIS — D649 Anemia, unspecified: Secondary | ICD-10-CM | POA: Diagnosis not present

## 2018-09-22 DIAGNOSIS — N840 Polyp of corpus uteri: Secondary | ICD-10-CM | POA: Diagnosis not present

## 2018-09-22 DIAGNOSIS — I10 Essential (primary) hypertension: Secondary | ICD-10-CM | POA: Insufficient documentation

## 2018-09-22 DIAGNOSIS — N736 Female pelvic peritoneal adhesions (postinfective): Secondary | ICD-10-CM

## 2018-09-22 DIAGNOSIS — D250 Submucous leiomyoma of uterus: Secondary | ICD-10-CM | POA: Insufficient documentation

## 2018-09-22 DIAGNOSIS — Z90710 Acquired absence of both cervix and uterus: Secondary | ICD-10-CM | POA: Diagnosis present

## 2018-09-22 HISTORY — PX: LAPAROSCOPIC LYSIS OF ADHESIONS: SHX5905

## 2018-09-22 HISTORY — PX: TOTAL LAPAROSCOPIC HYSTERECTOMY WITH SALPINGECTOMY: SHX6742

## 2018-09-22 HISTORY — PX: CYSTOSCOPY: SHX5120

## 2018-09-22 LAB — TYPE AND SCREEN
ABO/RH(D): O POS
Antibody Screen: NEGATIVE

## 2018-09-22 LAB — POCT PREGNANCY, URINE: Preg Test, Ur: NEGATIVE

## 2018-09-22 SURGERY — HYSTERECTOMY, TOTAL, LAPAROSCOPIC, WITH SALPINGECTOMY
Anesthesia: General | Site: Bladder

## 2018-09-22 MED ORDER — SUGAMMADEX SODIUM 200 MG/2ML IV SOLN
INTRAVENOUS | Status: AC
  Filled 2018-09-22: qty 2

## 2018-09-22 MED ORDER — EPHEDRINE SULFATE 50 MG/ML IJ SOLN
INTRAMUSCULAR | Status: DC | PRN
  Administered 2018-09-22 (×2): 10 mg via INTRAVENOUS

## 2018-09-22 MED ORDER — HYDROMORPHONE HCL 1 MG/ML IJ SOLN
INTRAMUSCULAR | Status: AC
  Filled 2018-09-22: qty 1

## 2018-09-22 MED ORDER — RIZATRIPTAN BENZOATE 10 MG PO TBDP: 10.0000 mg | ORAL_TABLET | Freq: Once | ORAL | Status: DC | PRN

## 2018-09-22 MED ORDER — METRONIDAZOLE IN NACL 5-0.79 MG/ML-% IV SOLN
500.0000 mg | INTRAVENOUS | Status: AC
  Administered 2018-09-22: 500 mg via INTRAVENOUS
  Filled 2018-09-22: qty 100

## 2018-09-22 MED ORDER — LIDOCAINE 2% (20 MG/ML) 5 ML SYRINGE
INTRAMUSCULAR | Status: AC
  Filled 2018-09-22: qty 5

## 2018-09-22 MED ORDER — LACTATED RINGERS IV SOLN
INTRAVENOUS | Status: DC
  Filled 2018-09-22: qty 1000

## 2018-09-22 MED ORDER — HYDROMORPHONE HCL 2 MG PO TABS
2.0000 mg | ORAL_TABLET | ORAL | Status: DC | PRN
  Administered 2018-09-22 – 2018-09-23 (×4): 2 mg via ORAL
  Filled 2018-09-22: qty 1

## 2018-09-22 MED ORDER — HYDROMORPHONE HCL 2 MG PO TABS
ORAL_TABLET | ORAL | Status: AC
  Filled 2018-09-22: qty 1

## 2018-09-22 MED ORDER — DIVALPROEX SODIUM ER 500 MG PO TB24
1000.0000 mg | ORAL_TABLET | Freq: Every day | ORAL | Status: DC
  Administered 2018-09-22: 1000 mg via ORAL
  Filled 2018-09-22 (×2): qty 2

## 2018-09-22 MED ORDER — LACTATED RINGERS IV SOLN
INTRAVENOUS | Status: DC
  Administered 2018-09-22: 10:00:00 via INTRAVENOUS
  Administered 2018-09-22: 125 mL/h via INTRAVENOUS
  Filled 2018-09-22: qty 1000

## 2018-09-22 MED ORDER — ROCURONIUM BROMIDE 10 MG/ML (PF) SYRINGE
PREFILLED_SYRINGE | INTRAVENOUS | Status: DC | PRN
  Administered 2018-09-22: 50 mg via INTRAVENOUS

## 2018-09-22 MED ORDER — MENTHOL 3 MG MT LOZG
1.0000 | LOZENGE | OROMUCOSAL | Status: DC | PRN
  Filled 2018-09-22: qty 9

## 2018-09-22 MED ORDER — ALPRAZOLAM 1 MG PO TABS
2.0000 mg | ORAL_TABLET | Freq: Every day | ORAL | Status: DC | PRN
  Administered 2018-09-22: 2 mg via ORAL
  Filled 2018-09-22 (×2): qty 2

## 2018-09-22 MED ORDER — PROPOFOL 10 MG/ML IV BOLUS
INTRAVENOUS | Status: DC | PRN
  Administered 2018-09-22: 130 mg via INTRAVENOUS

## 2018-09-22 MED ORDER — MIDAZOLAM HCL 2 MG/2ML IJ SOLN
INTRAMUSCULAR | Status: DC | PRN
  Administered 2018-09-22: 2 mg via INTRAVENOUS

## 2018-09-22 MED ORDER — ZOLPIDEM TARTRATE 10 MG PO TABS
10.0000 mg | ORAL_TABLET | Freq: Every evening | ORAL | Status: DC | PRN
  Filled 2018-09-22: qty 1

## 2018-09-22 MED ORDER — ENOXAPARIN SODIUM 40 MG/0.4ML ~~LOC~~ SOLN
40.0000 mg | SUBCUTANEOUS | Status: AC
  Administered 2018-09-22: 40 mg via SUBCUTANEOUS
  Filled 2018-09-22: qty 0.4

## 2018-09-22 MED ORDER — ROCURONIUM BROMIDE 100 MG/10ML IV SOLN
INTRAVENOUS | Status: AC
  Filled 2018-09-22: qty 1

## 2018-09-22 MED ORDER — PHENYTOIN SODIUM EXTENDED 100 MG PO CAPS
400.0000 mg | ORAL_CAPSULE | Freq: Two times a day (BID) | ORAL | Status: DC | PRN
  Filled 2018-09-22: qty 4

## 2018-09-22 MED ORDER — SODIUM CHLORIDE (PF) 0.9 % IJ SOLN
INTRAMUSCULAR | Status: AC
  Filled 2018-09-22: qty 50

## 2018-09-22 MED ORDER — ROPIVACAINE HCL 5 MG/ML IJ SOLN
INTRAMUSCULAR | Status: AC
  Filled 2018-09-22: qty 30

## 2018-09-22 MED ORDER — DEXAMETHASONE SODIUM PHOSPHATE 10 MG/ML IJ SOLN
INTRAMUSCULAR | Status: AC
  Filled 2018-09-22: qty 1

## 2018-09-22 MED ORDER — ONDANSETRON HCL 4 MG PO TABS
4.0000 mg | ORAL_TABLET | Freq: Four times a day (QID) | ORAL | Status: DC | PRN
  Filled 2018-09-22: qty 1

## 2018-09-22 MED ORDER — BUPIVACAINE HCL (PF) 0.25 % IJ SOLN
INTRAMUSCULAR | Status: AC
  Filled 2018-09-22: qty 30

## 2018-09-22 MED ORDER — KETAMINE HCL 10 MG/ML IJ SOLN
INTRAMUSCULAR | Status: AC
  Filled 2018-09-22: qty 1

## 2018-09-22 MED ORDER — CIPROFLOXACIN IN D5W 400 MG/200ML IV SOLN
INTRAVENOUS | Status: AC
  Filled 2018-09-22: qty 200

## 2018-09-22 MED ORDER — PHENYTOIN SODIUM EXTENDED 100 MG PO CAPS
400.0000 mg | ORAL_CAPSULE | Freq: Two times a day (BID) | ORAL | Status: DC
  Filled 2018-09-22: qty 4

## 2018-09-22 MED ORDER — CIPROFLOXACIN IN D5W 400 MG/200ML IV SOLN
400.0000 mg | INTRAVENOUS | Status: AC
  Administered 2018-09-22: 400 mg via INTRAVENOUS
  Filled 2018-09-22: qty 200

## 2018-09-22 MED ORDER — LIDOCAINE 2% (20 MG/ML) 5 ML SYRINGE
INTRAMUSCULAR | Status: DC | PRN
  Administered 2018-09-22: 60 mg via INTRAVENOUS

## 2018-09-22 MED ORDER — ONDANSETRON HCL 4 MG/2ML IJ SOLN
INTRAMUSCULAR | Status: AC
  Filled 2018-09-22: qty 2

## 2018-09-22 MED ORDER — KETOROLAC TROMETHAMINE 30 MG/ML IJ SOLN
INTRAMUSCULAR | Status: AC
  Filled 2018-09-22: qty 1

## 2018-09-22 MED ORDER — PROPOFOL 10 MG/ML IV BOLUS
INTRAVENOUS | Status: AC
  Filled 2018-09-22: qty 40

## 2018-09-22 MED ORDER — BUPIVACAINE HCL (PF) 0.25 % IJ SOLN
INTRAMUSCULAR | Status: DC | PRN
  Administered 2018-09-22: 12 mL

## 2018-09-22 MED ORDER — ALPRAZOLAM 0.5 MG PO TABS
ORAL_TABLET | ORAL | Status: AC
  Filled 2018-09-22: qty 3

## 2018-09-22 MED ORDER — NICOTINE 21 MG/24HR TD PT24
21.0000 mg | MEDICATED_PATCH | Freq: Every day | TRANSDERMAL | Status: DC
  Administered 2018-09-22: 21 mg via TRANSDERMAL
  Filled 2018-09-22 (×2): qty 1

## 2018-09-22 MED ORDER — ENOXAPARIN SODIUM 40 MG/0.4ML ~~LOC~~ SOLN
40.0000 mg | SUBCUTANEOUS | Status: DC
  Administered 2018-09-23: 40 mg via SUBCUTANEOUS
  Filled 2018-09-22: qty 0.4

## 2018-09-22 MED ORDER — DEXAMETHASONE SODIUM PHOSPHATE 10 MG/ML IJ SOLN
INTRAMUSCULAR | Status: DC | PRN
  Administered 2018-09-22: 10 mg via INTRAVENOUS

## 2018-09-22 MED ORDER — HYDROMORPHONE HCL 1 MG/ML IJ SOLN
1.0000 mg | INTRAMUSCULAR | Status: DC | PRN
  Administered 2018-09-22 (×2): 1 mg via INTRAVENOUS
  Filled 2018-09-22: qty 1

## 2018-09-22 MED ORDER — LISINOPRIL 20 MG PO TABS
20.0000 mg | ORAL_TABLET | Freq: Every day | ORAL | Status: DC
  Administered 2018-09-22 – 2018-09-23 (×2): 20 mg via ORAL
  Filled 2018-09-22: qty 1

## 2018-09-22 MED ORDER — MIDAZOLAM HCL 2 MG/2ML IJ SOLN
INTRAMUSCULAR | Status: AC
  Filled 2018-09-22: qty 2

## 2018-09-22 MED ORDER — FENTANYL CITRATE (PF) 100 MCG/2ML IJ SOLN
INTRAMUSCULAR | Status: DC | PRN
  Administered 2018-09-22: 100 ug via INTRAVENOUS
  Administered 2018-09-22: 50 ug via INTRAVENOUS

## 2018-09-22 MED ORDER — LIDOCAINE 2% (20 MG/ML) 5 ML SYRINGE
INTRAMUSCULAR | Status: DC | PRN
  Administered 2018-09-22: 1.5 mg/kg/h via INTRAVENOUS

## 2018-09-22 MED ORDER — SUGAMMADEX SODIUM 200 MG/2ML IV SOLN
INTRAVENOUS | Status: DC | PRN
  Administered 2018-09-22: 200 mg via INTRAVENOUS

## 2018-09-22 MED ORDER — KETAMINE HCL 10 MG/ML IJ SOLN
INTRAMUSCULAR | Status: DC | PRN
  Administered 2018-09-22: 25 mg via INTRAVENOUS
  Administered 2018-09-22: 10 mg via INTRAVENOUS

## 2018-09-22 MED ORDER — ENOXAPARIN SODIUM 40 MG/0.4ML ~~LOC~~ SOLN
SUBCUTANEOUS | Status: AC
  Filled 2018-09-22: qty 0.4

## 2018-09-22 MED ORDER — FENTANYL CITRATE (PF) 100 MCG/2ML IJ SOLN
25.0000 ug | INTRAMUSCULAR | Status: DC | PRN
  Administered 2018-09-22: 50 ug via INTRAVENOUS
  Administered 2018-09-22 (×2): 25 ug via INTRAVENOUS
  Filled 2018-09-22: qty 1

## 2018-09-22 MED ORDER — ARTIFICIAL TEARS OPHTHALMIC OINT
TOPICAL_OINTMENT | OPHTHALMIC | Status: AC
  Filled 2018-09-22: qty 3.5

## 2018-09-22 MED ORDER — ONDANSETRON HCL 4 MG/2ML IJ SOLN
INTRAMUSCULAR | Status: DC | PRN
  Administered 2018-09-22: 4 mg via INTRAVENOUS

## 2018-09-22 MED ORDER — QUETIAPINE FUMARATE 100 MG PO TABS
100.0000 mg | ORAL_TABLET | Freq: Every day | ORAL | Status: DC
  Administered 2018-09-22: 100 mg via ORAL
  Filled 2018-09-22 (×2): qty 1

## 2018-09-22 MED ORDER — VENLAFAXINE HCL ER 75 MG PO CP24
75.0000 mg | ORAL_CAPSULE | Freq: Every day | ORAL | Status: DC
  Filled 2018-09-22: qty 1

## 2018-09-22 MED ORDER — SODIUM CHLORIDE 0.9 % IR SOLN
Status: DC | PRN
  Administered 2018-09-22: 3000 mL

## 2018-09-22 MED ORDER — MULTIVITAMIN ADULT PO CHEW: CHEWABLE_TABLET | Freq: Three times a day (TID) | ORAL | Status: DC

## 2018-09-22 MED ORDER — DOXEPIN HCL 25 MG PO CAPS
25.0000 mg | ORAL_CAPSULE | Freq: Every day | ORAL | Status: DC
  Administered 2018-09-22: 25 mg via ORAL
  Filled 2018-09-22: qty 1

## 2018-09-22 MED ORDER — FENTANYL CITRATE (PF) 250 MCG/5ML IJ SOLN
INTRAMUSCULAR | Status: AC
  Filled 2018-09-22: qty 5

## 2018-09-22 MED ORDER — METRONIDAZOLE IN NACL 5-0.79 MG/ML-% IV SOLN
INTRAVENOUS | Status: AC
  Filled 2018-09-22: qty 100

## 2018-09-22 MED ORDER — SODIUM CHLORIDE 0.9% FLUSH
INTRAVENOUS | Status: DC | PRN
  Administered 2018-09-22: 10 mL via INTRAVENOUS

## 2018-09-22 MED ORDER — ONDANSETRON HCL 4 MG/2ML IJ SOLN
4.0000 mg | Freq: Four times a day (QID) | INTRAMUSCULAR | Status: DC | PRN
  Filled 2018-09-22: qty 2

## 2018-09-22 MED ORDER — SODIUM CHLORIDE 0.9 % IV SOLN
INTRAVENOUS | Status: DC | PRN
  Administered 2018-09-22: 60 mL

## 2018-09-22 MED ORDER — OXYCODONE HCL 5 MG PO TABS
5.0000 mg | ORAL_TABLET | Freq: Once | ORAL | Status: AC | PRN
  Administered 2018-09-23: 5 mg via ORAL
  Filled 2018-09-22: qty 1

## 2018-09-22 MED ORDER — CLONAZEPAM 1 MG PO TABS
1.0000 mg | ORAL_TABLET | Freq: Three times a day (TID) | ORAL | Status: DC | PRN
  Filled 2018-09-22: qty 1

## 2018-09-22 MED ORDER — FENTANYL CITRATE (PF) 100 MCG/2ML IJ SOLN
INTRAMUSCULAR | Status: AC
  Filled 2018-09-22: qty 2

## 2018-09-22 MED ORDER — OXYCODONE HCL 5 MG/5ML PO SOLN
5.0000 mg | Freq: Once | ORAL | Status: AC | PRN
  Filled 2018-09-22: qty 5

## 2018-09-22 SURGICAL SUPPLY — 63 items
ADH SKN CLS APL DERMABOND .7 (GAUZE/BANDAGES/DRESSINGS) ×3
APL SRG 38 LTWT LNG FL B (MISCELLANEOUS)
APPLICATOR ARISTA FLEXITIP XL (MISCELLANEOUS) IMPLANT
BARRIER ADHS 3X4 INTERCEED (GAUZE/BANDAGES/DRESSINGS) IMPLANT
BRR ADH 4X3 ABS CNTRL BYND (GAUZE/BANDAGES/DRESSINGS)
CABLE HIGH FREQUENCY MONO STRZ (ELECTRODE) IMPLANT
CANISTER SUCT 3000ML PPV (MISCELLANEOUS) ×5 IMPLANT
CELL SAVER LIPIGURD (MISCELLANEOUS) IMPLANT
COVER MAYO STAND STRL (DRAPES) ×5 IMPLANT
COVER TABLE BACK 60X90 (DRAPES) IMPLANT
COVER WAND RF STERILE (DRAPES) ×5 IMPLANT
DECANTER SPIKE VIAL GLASS SM (MISCELLANEOUS) ×10 IMPLANT
DERMABOND ADVANCED (GAUZE/BANDAGES/DRESSINGS) ×2
DERMABOND ADVANCED .7 DNX12 (GAUZE/BANDAGES/DRESSINGS) ×3 IMPLANT
DEVICE RETRIEVAL ALEXIS 14 (MISCELLANEOUS) IMPLANT
DURAPREP 26ML APPLICATOR (WOUND CARE) ×5 IMPLANT
EXTRT SYSTEM ALEXIS 14CM (MISCELLANEOUS)
EXTRT SYSTEM ALEXIS 17CM (MISCELLANEOUS)
GAUZE 4X4 16PLY RFD (DISPOSABLE) ×5 IMPLANT
GLOVE BIO SURGEON STRL SZ 6.5 (GLOVE) ×8 IMPLANT
GLOVE BIO SURGEONS STRL SZ 6.5 (GLOVE) ×2
GLOVE BIOGEL PI IND STRL 7.0 (GLOVE) ×9 IMPLANT
GLOVE BIOGEL PI INDICATOR 7.0 (GLOVE) ×6
GOWN STRL REUS W/TWL LRG LVL3 (GOWN DISPOSABLE) ×20 IMPLANT
HEMOSTAT ARISTA ABSORB 3G PWDR (HEMOSTASIS) IMPLANT
HOLDER FOLEY CATH W/STRAP (MISCELLANEOUS) IMPLANT
LIGASURE VESSEL 5MM BLUNT TIP (ELECTROSURGICAL) ×5 IMPLANT
NEEDLE INSUFFLATION 120MM (ENDOMECHANICALS) ×5 IMPLANT
OCCLUDER COLPOPNEUMO (BALLOONS) ×5 IMPLANT
PACK LAPAROSCOPY BASIN (CUSTOM PROCEDURE TRAY) ×5 IMPLANT
PACK TRENDGUARD 450 HYBRID PRO (MISCELLANEOUS) IMPLANT
POUCH LAPAROSCOPIC INSTRUMENT (MISCELLANEOUS) ×5 IMPLANT
PROTECTOR NERVE ULNAR (MISCELLANEOUS) ×10 IMPLANT
RETRACTOR WOUND ALXS 19CM XSML (INSTRUMENTS) IMPLANT
RTRCTR WOUND ALEXIS 19CM XSML (INSTRUMENTS)
SCISSORS LAP 5X35 DISP (ENDOMECHANICALS) IMPLANT
SET IRRIG TUBING LAPAROSCOPIC (IRRIGATION / IRRIGATOR) ×5 IMPLANT
SET IRRIG Y TYPE TUR BLADDER L (SET/KITS/TRAYS/PACK) ×5 IMPLANT
SET TRI-LUMEN FLTR TB AIRSEAL (TUBING) ×5 IMPLANT
SHEARS HARMONIC ACE PLUS 36CM (ENDOMECHANICALS) ×5 IMPLANT
SLEEVE ADV FIXATION 5X100MM (TROCAR) ×5 IMPLANT
SUT VIC AB 0 CT1 27 (SUTURE) ×10
SUT VIC AB 0 CT1 27XBRD ANBCTR (SUTURE) ×6 IMPLANT
SUT VICRYL 0 UR6 27IN ABS (SUTURE) IMPLANT
SUT VICRYL 4-0 PS2 18IN ABS (SUTURE) ×5 IMPLANT
SUT VLOC 180 0 9IN  GS21 (SUTURE)
SUT VLOC 180 0 9IN GS21 (SUTURE) IMPLANT
SYR 10ML LL (SYRINGE) ×5 IMPLANT
SYR 50ML LL SCALE MARK (SYRINGE) ×10 IMPLANT
SYSTEM CARTER THOMASON II (TROCAR) IMPLANT
SYSTEM CONTND EXTRCTN KII BLLN (MISCELLANEOUS) IMPLANT
TIP RUMI ORANGE 6.7MMX12CM (TIP) IMPLANT
TIP UTERINE 5.1X6CM LAV DISP (MISCELLANEOUS) IMPLANT
TIP UTERINE 6.7X10CM GRN DISP (MISCELLANEOUS) IMPLANT
TIP UTERINE 6.7X6CM WHT DISP (MISCELLANEOUS) IMPLANT
TIP UTERINE 6.7X8CM BLUE DISP (MISCELLANEOUS) ×2 IMPLANT
TOWEL OR 17X26 10 PK STRL BLUE (TOWEL DISPOSABLE) ×10 IMPLANT
TRAY FOLEY W/BAG SLVR 14FR (SET/KITS/TRAYS/PACK) ×5 IMPLANT
TRENDGUARD 450 HYBRID PRO PACK (MISCELLANEOUS) ×5
TROCAR ADV FIXATION 5X100MM (TROCAR) ×5 IMPLANT
TROCAR PORT AIRSEAL 8X120 (TROCAR) ×5 IMPLANT
TROCAR XCEL NON-BLD 5MMX100MML (ENDOMECHANICALS) ×5 IMPLANT
WARMER LAPAROSCOPE (MISCELLANEOUS) ×5 IMPLANT

## 2018-09-22 NOTE — Progress Notes (Signed)
09/22/2018 1445 RN went in room to check on patient, pt. Asleep on left side. Upon awakening, pt. Stated severe pain LLQ, RN pulled back gown  And noted LLQ incision with moderate oozing of blood from site. Pressure applied to site. MD contacted and notified. Surrounding tissue soft and non-tender to palpation. BP checked and WNL. MD [facility] to [facility] patient. Manual pressure held by RN x 25 minutes until MD at bedside. MD reinforced incision with Derma-bond and pressure dressing reapplied to site. PRN pain medication given to patient as well as PRN Xanax for anxiety. Emotional support provided to patient who was very tearful and worried about current situation. Will continue to closely monitor patient.  Brownie Gockel, Arville Lime

## 2018-09-22 NOTE — Progress Notes (Signed)
09/22/2018 1145 Dr. Quincy Simmonds contacted and made aware pt. Attempted to void unsuccessful, pt. Uncomfortable with urinary urgency. Bladder scan performed revealing 470 ml residual urine in bladder. Verbal order received ok to I&O cath x 1. Order enacted. 700 ml clear yellow urine returned from bladder. Pt. Stating relief of symptoms. Will continue to closely monitor patient.  Janet Cohen, Arville Lime

## 2018-09-22 NOTE — Anesthesia Procedure Notes (Signed)
Procedure Name: Intubation Date/Time: 09/22/2018 7:35 AM Performed by: Wanita Chamberlain, CRNA Pre-anesthesia Checklist: Patient identified, Emergency Drugs available, Suction available, Patient being monitored and Timeout performed Patient Re-evaluated:Patient Re-evaluated prior to induction Oxygen Delivery Method: Circle system utilized Preoxygenation: Pre-oxygenation with 100% oxygen Induction Type: IV induction Ventilation: Mask ventilation without difficulty Laryngoscope Size: Mac and 3 Grade View: Grade I Tube type: Oral Tube size: 7.0 mm Number of attempts: 1 Airway Equipment and Method: Stylet Placement Confirmation: breath sounds checked- equal and bilateral,  CO2 detector,  positive ETCO2 and ETT inserted through vocal cords under direct vision Secured at: 21 cm Tube secured with: Tape Dental Injury: Teeth and Oropharynx as per pre-operative assessment

## 2018-09-22 NOTE — Transfer of Care (Signed)
Immediate Anesthesia Transfer of Care Note  Patient: Shaira M Deshler  Procedure(s) Performed: TOTAL LAPAROSCOPIC HYSTERECTOMY WITH SALPINGECTOMY (Bilateral Abdomen) CYSTOSCOPY (N/A Bladder) LAPAROSCOPIC LYSIS OF ADHESIONS (N/A Abdomen)  Patient Location: PACU  Anesthesia Type:General  Level of Consciousness: awake, alert , oriented and patient cooperative  Airway & Oxygen Therapy: Patient Spontanous Breathing and Patient connected to nasal cannula oxygen  Post-op Assessment: Report given to RN and Post -op Vital signs reviewed and stable  Post vital signs: Reviewed and stable  Last Vitals:  Vitals Value Taken Time  BP    Temp    Pulse    Resp    SpO2      Last Pain:  Vitals:   09/22/18 0554  TempSrc:   PainSc: 0-No pain      Patients Stated Pain Goal: 5 ([id number])  Complications: No apparent anesthesia complications

## 2018-09-22 NOTE — Progress Notes (Signed)
09/22/2018 1900 Dr. Quincy Simmonds called per patient request, PRN PO Dilaudid given at 1800, pt. Stating no relief of symptoms. Verbal order received from Dr. Quincy Simmonds ok to administer IV PRN Dilaudid as ordered at this time for pain management. Orders enacted. Will continue to closely monitor patient.  Hilmer Aliberti, Arville Lime

## 2018-09-22 NOTE — Progress Notes (Signed)
Update to History and Physical  No marked change in status since office pre-op visit.   Patient examined.   OK to proceed with surgery. 

## 2018-09-22 NOTE — Progress Notes (Signed)
Day of Surgery Procedure(s) (LRB): TOTAL LAPAROSCOPIC HYSTERECTOMY WITH SALPINGECTOMY (Bilateral) CYSTOSCOPY (N/A) LAPAROSCOPIC LYSIS OF ADHESIONS (N/A)  Subjective: Patient reports tolerating PO and no problems voiding.   Had pain in her bladder before she was able to void.  Able to ambulate and wants to get up more.  Tolerating a regular diet.   States she takes Dilantin 500 mg po bid prn onset of seizure.  She reports she has an aura prior to seizure starting.   Nursing call to me by phone regarding left lower quadrant incisional bleeding.  Pressure bandage was placed.   Objective: I have reviewed patient's vital signs, intake and output and labs. Vitals:   09/22/18 1200 09/22/18 1306  BP: (!) 150/85 139/82  Pulse: 85 80  Resp: 16 16  Temp: 97.7 F (36.5 C) 98.5 F (36.9 C)  SpO2: 100% 100%    I/O - 1925 cc/1025 cc.  General: alert and cooperative Resp: clear to auscultation bilaterally Cardio: regular rate and rhythm, S1, S2 normal, no murmur, click, rub or gallop GI: soft, non-tender; bowel sounds normal; no masses,  no organomegaly and incision: clean, dry and intact Extremities: PAS and ted hose on. DPs 2+ bilaterally.  Vaginal Bleeding: none   Left lower quadrant incision reinforced with Dermabond.   Assessment: s/p Procedure(s) with comments: TOTAL LAPAROSCOPIC HYSTERECTOMY WITH SALPINGECTOMY (Bilateral) - Possible BSO. Extended recovery bed needed. CYSTOSCOPY (N/A) LAPAROSCOPIC LYSIS OF ADHESIONS (N/A): stable  Plan: Encourage ambulation Advance to PO medication Continue in hospital care overnight.   CBC and BMP in am.  Lovenox in am.  Surgical findings and procedure reviewed.    LOS: 0 days    Janet Cohen 09/22/2018, 3:18 PM

## 2018-09-22 NOTE — Op Note (Signed)
OPERATIVE REPORT  PREOPERATIVE DIAGNOSIS:  Abnormal uterine bleeding, fibroids, suspected adenomyosis.  POSTOPERATIVE DIAGNOSIS:  Abnormal uterine bleeding, fibroids, suspected adenomyosis.   PROCEDURES:  Total laparoscopic hysterectomy with bilateral salpingectomy, lysis of adhesions, cystoscopy  SURGEON:  Lenard Galloway, M.D.  ASSISTANT:   Edwinna Areola, M.D.  ANESTHESIA:  General endotracheal, intraperitoneal ropivicaine 30 mL diluted in 30 mL of normal saline, local with 0.25% Marcaine.  IVF:  1000 cc LR  ESTIMATED BLOOD LOSS:   25 cc  URINE OUTPUT:   409 cc  COMPLICATIONS:  None.  INDICATIONS FOR THE PROCEDURE:     The patient is a 53 year old Para 63 African American female who presents with daily vaginal bleeding.  Pelvic ultrasound documents small fibroids and possible adenomyosis.  Endometrial biopsy showed benign endometrium. The patient declined long term medical therapy.  She declines future childbearing, and is requesting hysterectomy procedure.   A plan is made to proceed with a total laparoscopic hysterectomy with bilateral salpingectomy, possible bilateral oophorectomy, and cystoscopy after risks, benefits, and alternatives are reviewed.  FINDINGS:     Laparoscopy revealed a normal uterus, bilateral tubes and ovaries.  There were filmy adhesions around the bilateral adnexa.  The upper abdomen showed a gallbladder with omentum adherent to it.  There was minor adhesive adhesive disease of omentum in the left and right upper abdomen.   The appendix appeared fixed in place, but not dilated.  No endometriosis was seen in the abdomen or pelvis.     Cystoscopy at the termination of the procedure showed the bladder to be normal throughout 360 degrees including the bladder dome and trigone. There was no evidence of any foreign body in the bladder or the urethra. There was no evidence of any lesions  of the bladder or the urethra.   Both of the ureters were noted to be patent  bilaterally.  SPECIMENS:     The uterus, cervix, and bilateral tubes were went to pathology.   DESCRIPTION OF PROCEDURE:    The patient was reidentified in the preoperative hold area.   She did receive Flagyl and Ciprofloxacin IV for antibiotic prophylaxis.  She received Lovenox, TED hose and PAS stockings for DVT prophylaxis.  In the operating room, the patient was placed in the dorsal lithotomy position on the operating room table.  The Trendguard was used to support her on the OR table.  Her legs were placed in the [place] stirrups and her arms were both padded and tucked at her sides. The patient received general endotracheal anesthesia. The abdomen and vagina were then sterilely prepped and she was sterilely draped.  A speculum was placed in the vagina and a single-tooth tenaculum was placed on the anterior cervical lip.  A figure-of-eight suture of 0 Vicryl was placed on each the anterior and the posterior cervical lips. The uterus was sounded to _ 8___ cm.  The cervix was then dilated with Hegar dilators.  A #  __8____ RUMI tip with a small metal KOH ring was then placed through the cervix and into the uterine cavity without difficulty.  The remaining vaginal instruments were then removed.  A Foley catheter was placed inside the bladder.  Attention was turned to the abdomen where the umbilical region was injected with 0.25% Marcaine and a small incision created.  A Veress needle was then used to insufflate the abdomen with CO2 gas after a saline drop test was performed and the fluid flowed freely.  A 5 mm umbilical incision was  created with a scalpel after the skin. A 5 mm camera port was then placed using the Optiview.  5 mm incisions were then created in the left upper abdomen and the left lower abdomen after the skin was injected locally with 0.25% Marcaine.  The 5 mm trocars were then placed under visualization of the laparoscope.  An 8 mm trocar was placed in the right lower quadrant after  injecting with Marcaine and incising with a scalpel.     Ropivicine was placed inside the peritoneal cavity.   The patient was placed in Trendelenburg position.  An inspection of the abdomen and pelvis was performed. The findings are as noted above.   The bilateral ureters were identified.  The adhesions around the bilateral adnexa were lysed with the Harmonic scalpel. The left fallopian tube was grasped and the Ligasure was used to cauterize and cut through the mesosalpinx and then the proximal tube.  The specimen was sent to pathology.  The left utero-ovarian ligament was similarly cauterized and cut with the Ligasure.  The left round ligament was then cauterized and divided with same instrument.  Dissection was performed to the anterior and posterior leaves of the broad ligaments using the Harmonic scalpel.  The incision was carried across the anterior cul- de-sac along the vesicouterine fold and the bladder was dissected away from the cervix using the Harmonic scalpel. The peritoneum was taken down posteriorly.  The left uterine artery was skeletonized.   It was then cauterized and cut with the Ligasure instrument.  Attention was turned to the patient's right-hand side at this time.  The same procedure that was performed on the left side was repeated on the right side with respect to the isolation, cautery, and transection of the vessels and the bladder flap dissection.   The right fallopian tube was removed from the peritoneal cavity as well.  The KOH ring was nicely visible.  The colpotomy incision was performed with the Harmonic scalpel in a circumferential fashion. The specimen was then removed from the peritoneal cavity and was later sent to Pathology.  The vaginal cuff was sutured at this time using a running suture of 0 V-Loc.  The vagina was closed from the patient's right hand side to the left hand side and then back 2 sutures towards the midline.  This provided good full-thickness closure of  the vaginal cuff.  The laparoscopic needle for suturing was removed from the peritoneal cavity.  The pelvis was irrigated and suctioned.   The pneumoperitoneal was let down.  There was good hemostasis of the operative sites and pedicles.  Carleene Overlie was placed in the pelvis over the operating sites.   The 8 mm trocar was removed and the [employer] instrument was used to close the fascia with a 0/0 vicryl suture.   The remaining left sided laparoscopic trocars were removed after the CO2 pneumoperitoneum was released and the patient received manual breaths.    The patient's Foley catheter was removed at this time and cystoscopy was performed and the findings are as noted above.  The Foley catheter was left out.   Final inspection of the vagina demonstrated good hemostasis of the vaginal cuff.  All skin incisions were closed with subcuticular sutures of 4-0 Vicryl.  Dermabond was placed over the incisions.  This concluded the patient's procedure.  She was extubated and escorted to the recovery room in stable and awake condition.  There were no complications to the procedure.  All needle, instrument, and sponge counts  were correct.

## 2018-09-23 ENCOUNTER — Telehealth: Payer: Self-pay | Admitting: Obstetrics and Gynecology

## 2018-09-23 DIAGNOSIS — N939 Abnormal uterine and vaginal bleeding, unspecified: Secondary | ICD-10-CM | POA: Diagnosis not present

## 2018-09-23 LAB — URINALYSIS, COMPLETE (UACMP) WITH MICROSCOPIC
Bilirubin Urine: NEGATIVE
Glucose, UA: NEGATIVE mg/dL
Ketones, ur: NEGATIVE mg/dL
Leukocytes,Ua: NEGATIVE
Nitrite: NEGATIVE
Protein, ur: NEGATIVE mg/dL
Specific Gravity, Urine: 1.003 — ABNORMAL LOW (ref 1.005–1.030)
pH: 7 (ref 5.0–8.0)

## 2018-09-23 LAB — CBC
HEMATOCRIT: 33 % — AB (ref 36.0–46.0)
Hemoglobin: 10.4 g/dL — ABNORMAL LOW (ref 12.0–15.0)
MCH: 25.9 pg — ABNORMAL LOW (ref 26.0–34.0)
MCHC: 31.5 g/dL (ref 30.0–36.0)
MCV: 82.1 fL (ref 80.0–100.0)
Platelets: 273 10*3/uL (ref 150–400)
RBC: 4.02 MIL/uL (ref 3.87–5.11)
RDW: 14.9 % (ref 11.5–15.5)
WBC: 9.9 10*3/uL (ref 4.0–10.5)
nRBC: 0 % (ref 0.0–0.2)

## 2018-09-23 LAB — BASIC METABOLIC PANEL
Anion gap: 8 (ref 5–15)
BUN: <5 mg/dL — ABNORMAL LOW (ref 6–20)
CO2: 25 mmol/L (ref 22–32)
Calcium: 8.7 mg/dL — ABNORMAL LOW (ref 8.9–10.3)
Chloride: 109 mmol/L (ref 98–111)
Creatinine, Ser: 0.82 mg/dL (ref 0.44–1.00)
GFR calc Af Amer: >60 mL/min (ref >60)
GFR calc non Af Amer: >60 mL/min (ref >60)
GLUCOSE: 107 mg/dL — AB (ref 70–99)
Potassium: 2.8 mmol/L — ABNORMAL LOW (ref 3.5–5.1)
Sodium: 142 mmol/L (ref 135–145)

## 2018-09-23 MED ORDER — HYDROMORPHONE HCL 2 MG PO TABS
ORAL_TABLET | ORAL | Status: AC
  Filled 2018-09-23: qty 1

## 2018-09-23 MED ORDER — ENOXAPARIN SODIUM 40 MG/0.4ML ~~LOC~~ SOLN: 40.0000 mg | SUBCUTANEOUS | 0 refills | Status: DC

## 2018-09-23 MED ORDER — OXYCODONE HCL 5 MG PO TABS
ORAL_TABLET | ORAL | Status: AC
  Filled 2018-09-23: qty 1

## 2018-09-23 MED ORDER — HYDROMORPHONE HCL 2 MG PO TABS: 2.0000 mg | ORAL_TABLET | ORAL | 0 refills | Status: DC | PRN

## 2018-09-23 MED ORDER — ENOXAPARIN SODIUM 40 MG/0.4ML ~~LOC~~ SOLN
SUBCUTANEOUS | Status: AC
  Filled 2018-09-23: qty 0.4

## 2018-09-23 NOTE — Telephone Encounter (Signed)
Call to patient and advised of message from Dr. Quincy Simmonds.  She is agreeable.  Will call back if any problems at the pharmacy or any concerns.  Encounter closed.

## 2018-09-23 NOTE — Progress Notes (Signed)
Dr. Quincy Simmonds called unit updated unable to in and out cath orders received to do clean catch also pt wants pill form for Lovenox

## 2018-09-23 NOTE — Progress Notes (Signed)
1 Day Post-Op Procedure(s) (LRB): TOTAL LAPAROSCOPIC HYSTERECTOMY WITH SALPINGECTOMY (Bilateral) CYSTOSCOPY (N/A) LAPAROSCOPIC LYSIS OF ADHESIONS (N/A)  Subjective: Patient reports tolerating PO, + flatus and no problems voiding.   Ambulating.   Had a low grade fever and nursing staff tried to do a urine cath but were not successful. Clean catch was sent instead.  Patient using her incentive spirometer.  Objective: I have reviewed patient's vital signs, intake and output and labs. Vitals:   09/23/18 0530 09/23/18 0855  BP: 137/80 137/74  Pulse: 75 74  Resp: 18 18  Temp: (!) 100.4 F (38 C) 99.2 F (37.3 C)  SpO2: 100% 100%   UO 3175 cc   CBC    Component Value Date/Time   WBC 9.9 09/23/2018 0523   RBC 4.02 09/23/2018 0523   HGB 10.4 (L) 09/23/2018 0523   HGB 12.0 08/19/2018 1618   HCT 33.0 (L) 09/23/2018 0523   HCT 37.7 08/19/2018 1618   PLT 273 09/23/2018 0523   PLT 334 08/19/2018 1618   MCV 82.1 09/23/2018 0523   MCV 83 08/19/2018 1618   MCH 25.9 (L) 09/23/2018 0523   MCHC 31.5 09/23/2018 0523   RDW 14.9 09/23/2018 0523   RDW 13.3 08/19/2018 1618   LYMPHSABS 3.1 04/01/2018 2228   MONOABS 0.4 04/01/2018 2228   EOSABS 0.2 04/01/2018 2228   BASOSABS 0.0 04/01/2018 2228   K = 2.8  Urinalysis - rare bacteria and small hgb.  No WBC.   General: alert and cooperative Resp: clear to auscultation bilaterally Cardio: regular rate and rhythm, S1, S2 normal, no murmur, click, rub or gallop GI: soft, non-tender; bowel sounds normal; no masses,  no organomegaly and incision: clean, dry and intact Vaginal Bleeding: none  Assessment: s/p Procedure(s) with comments: TOTAL LAPAROSCOPIC HYSTERECTOMY WITH SALPINGECTOMY (Bilateral) - Possible BSO. Extended recovery bed needed. CYSTOSCOPY (N/A) LAPAROSCOPIC LYSIS OF ADHESIONS (N/A): progressing well  Low grade temp.  Likely atelectasis.   Plan: Discharge home  Instructions reviewed in verbal and written form.  Rx for  Dilaudid.  Cautioned not to mix with Percocet and her anxiety and sleep medication.  She will not use the Megace for one month.  She will use Lovenox for 2 weeks post op and be instructed in proper self administration.  Increase intake of K rich foods.  FU in office in one week.   LOS: 0 days    Arloa Koh 09/23/2018, 9:29 AM

## 2018-09-23 NOTE — Progress Notes (Signed)
09/23/2018. 9:57 AM D/c avs form, medications already taken today and those due this evening given and explained to patient. Follow up appointments and when to call MD reviewed. RX reviewed. D/c iv. D/c home per orders.  Kerianne Gurr, Arville Lime

## 2018-09-23 NOTE — Progress Notes (Signed)
Dr. Quincy Simmonds called and notified of pt temp at 2am  100.6 and now 100.4, using incentive spirometer and will walk in hall this am.   No new orders

## 2018-09-23 NOTE — Telephone Encounter (Signed)
Spoke with patient at incoming phone call.  Lovenox injection not available for pick up until tomorrow after 1 pm per patient at [facility]. She reports she has had her injection today at the hospital.   Wants to know if she can wait until tomorrow afternoon to do her injection or transfer prescription for pick up today.

## 2018-09-23 NOTE — Discharge Instructions (Signed)
Total Laparoscopic Hysterectomy, Care After °This sheet gives you information about how to care for yourself after your procedure. Your health care provider may also give you more specific instructions. If you have problems or questions, contact your health care provider. °What can I expect after the procedure? °After the procedure, it is common to have: °· Pain and bruising around your incisions. °· A sore throat, if a breathing tube was used during surgery. °· Fatigue. °· Poor appetite. °· Less interest in sex. °If your ovaries were also removed, it is also common to have symptoms of menopause such as hot flashes, night sweats, and lack of sleep (insomnia). °Follow these instructions at home: °Bathing °· Do not take baths, swim, or use a hot tub until your health care provider approves. You may need to only take showers for 2-3 weeks. °· Keep your bandage (dressing) dry until your health care provider says it can be removed. °Incision care ° °· Follow instructions from your health care provider about how to take care of your incisions. Make sure you: °? Wash your hands with soap and water before you change your dressing. If soap and water are not available, use hand sanitizer. °? Change your dressing as told by your health care provider. °? Leave stitches (sutures), skin glue, or adhesive strips in place. These skin closures may need to stay in place for 2 weeks or longer. If adhesive strip edges start to loosen and curl up, you may trim the loose edges. Do not remove adhesive strips completely unless your health care provider tells you to do that. °· Check your incision area every day for signs of infection. Check for: °? Redness, swelling, or pain. °? Fluid or blood. °? Warmth. °? Pus or a bad smell. °Activity °· Get plenty of rest and sleep. °· Do not lift anything that is heavier than 10 lbs (4.5 kg) for one month after surgery, or as long as told by your health care provider. °· Do not drive or use heavy  machinery while taking prescription pain medicine. °· Do not drive for 24 hours if you were given a medicine to help you relax (sedative). °· Return to your normal activities as told by your health care provider. Ask your health care provider what activities are safe for you. °Lifestyle ° °· Do not use any products that contain nicotine or tobacco, such as cigarettes and e-cigarettes. These can delay healing. If you need help quitting, ask your health care provider. °· Do not drink alcohol until your health care provider approves. °General instructions °· Do not douche, use tampons, or have sex for at least 6 weeks, or as told by your health care provider. °· Take over-the-counter and prescription medicines only as told by your health care provider. °· To monitor yourself for a fever, take your temperature at least once a day during recovery. °· If you struggle with physical or emotional changes after your procedure, speak with your health care provider or a therapist. °· To prevent or treat constipation while you are taking prescription pain medicine, your health care provider may recommend that you: °? Drink enough fluid to keep your urine clear or pale yellow. °? Take over-the-counter or prescription medicines. °? Eat foods that are high in fiber, such as fresh fruits and vegetables, whole grains, and beans. °? Limit foods that are high in fat and processed sugars, such as fried and sweet foods. °· Keep all follow-up visits as told by your health care provider.   This is important. °Contact a health care provider if: °· You have chills or a fever. °· You have redness, swelling, or pain around an incision. °· You have fluid or blood coming from an incision. °· Your incision feels warm to the touch. °· You have pus or a bad smell coming from an incision. °· An incision breaks open. °· You feel dizzy or light-headed. °· You have pain or bleeding when you urinate. °· You have diarrhea, nausea, or vomiting that does not  go away. °· You have abnormal vaginal discharge. °· You have a rash. °· You have pain that does not get better with medicine. °Get help right away if: °· You have a fever and your symptoms suddenly get worse. °· You have severe abdominal pain. °· You have chest pain. °· You have shortness of breath. °· You faint. °· You have pain, swelling, or redness on your leg. °· You have heavy vaginal bleeding with blood clots. °Summary °· After the procedure it is common to have abdominal pain. Your provider will give you medication for this. °· Do not take baths, swim, or use a hot tub until your health care provider approves. °· Do not lift anything that is heavier than 10 lbs (4.5 kg) for one month after surgery, or as long as told by your health care provider. °· Notify your provider if you have any signs or symptoms of infection after the procedure. °This information is not intended to replace advice given to you by your health care provider. Make sure you discuss any questions you have with your health care provider. °Document Released: 05/12/2013 Document Revised: 10/02/2016 Document Reviewed: 10/02/2016 °Elsevier Interactive Patient Education © 2019 [employer] ° °

## 2018-09-23 NOTE — Progress Notes (Signed)
Dr. Quincy Simmonds called with orders.

## 2018-09-23 NOTE — Progress Notes (Signed)
Attempted to straight cath pt by 2 different RN with no success.   Dr. Quincy Simmonds called and message left on her phone to call [place].Marland Kitchen

## 2018-09-23 NOTE — Progress Notes (Signed)
Pt instructed on giving herself Lovenox by Donia Pounds.   Pt did very well with procedure and administered the injection to herself no questions or complaints.

## 2018-09-23 NOTE — Telephone Encounter (Signed)
Ok for her next injection to be done tomorrow afternoon.  She may have to wait to pick it up no matter which pharmacy she would choose.

## 2018-09-23 NOTE — Telephone Encounter (Signed)
Patient is calling regarding issue with getting medication post op. Patient is calling to see what her next steps would be. Call transferred to Karen Chafe, RN.

## 2018-09-24 ENCOUNTER — Telehealth: Payer: Self-pay | Admitting: Obstetrics and Gynecology

## 2018-09-24 ENCOUNTER — Encounter (HOSPITAL_BASED_OUTPATIENT_CLINIC_OR_DEPARTMENT_OTHER): Payer: Self-pay | Admitting: Obstetrics and Gynecology

## 2018-09-24 LAB — URINE CULTURE: Culture: NO GROWTH

## 2018-09-24 NOTE — Telephone Encounter (Signed)
Multiple calls made, unable to obtain pharmacy assistance.  Alternative obtained from Dr. Quincy Simmonds.  Returned call to patient with Lamont Snowball RN and patient states that a personal friend "senator" has paid for her medication and that her son is picking it up now.  She is aware needs to give herself injection when it is picked up and wear ted hose all the time except for bathing and to ambulate often.  Pt aware of instructions and verbalizes understanding.  Has post op Monday.  Encounter closed.

## 2018-09-24 NOTE — Telephone Encounter (Signed)
Spoke with patient.  She is unable to fill her Lovenox Rx due to cost.   Advised need to see if alternative can be sent in for her.  Will return call.  Pt agreeable.

## 2018-09-24 NOTE — Telephone Encounter (Signed)
Patient would like to speak with nurse about getting a new medication because of cost.

## 2018-09-24 NOTE — Anesthesia Postprocedure Evaluation (Signed)
Anesthesia Post Note  Patient: Janet Cohen  Procedure(s) Performed: TOTAL LAPAROSCOPIC HYSTERECTOMY WITH SALPINGECTOMY (Bilateral Abdomen) CYSTOSCOPY (N/A Bladder) LAPAROSCOPIC LYSIS OF ADHESIONS (N/A Abdomen)     Patient location during evaluation: PACU Anesthesia Type: General Level of consciousness: awake and alert Pain management: pain level controlled Vital Signs Assessment: post-procedure vital signs reviewed and stable Respiratory status: spontaneous breathing, nonlabored ventilation, respiratory function stable and patient connected to nasal cannula oxygen Cardiovascular status: blood pressure returned to baseline and stable Postop Assessment: no apparent nausea or vomiting Anesthetic complications: no    Last Vitals:  Vitals:   09/23/18 0530 09/23/18 0855  BP: 137/80 137/74  Pulse: 75 74  Resp: 18 18  Temp: (!) 38 C 37.3 C  SpO2: 100% 100%    Last Pain:  Vitals:   09/23/18 0855  TempSrc:   PainSc: [place]

## 2018-09-25 ENCOUNTER — Telehealth: Payer: Self-pay | Admitting: Emergency Medicine

## 2018-09-25 NOTE — Telephone Encounter (Signed)
Pt contacted on call MD Dr. Sabra Heck last night. Did not return calls.  Call to patient.  She states the pharmacy dispensed only one syringe but her husband was able to work with pharmacy and Rx was taken care of and she did receive the 13 syringes that were ordered and paid for.

## 2018-09-28 ENCOUNTER — Encounter: Payer: Self-pay | Admitting: Obstetrics and Gynecology

## 2018-09-28 ENCOUNTER — Other Ambulatory Visit: Payer: Self-pay

## 2018-09-28 ENCOUNTER — Ambulatory Visit (INDEPENDENT_AMBULATORY_CARE_PROVIDER_SITE_OTHER): Payer: BLUE CROSS/BLUE SHIELD | Admitting: Obstetrics and Gynecology

## 2018-09-28 VITALS — BP 110/60 | HR 100 | Temp 98.6°F | Ht 66.25 in | Wt 122.6 lb

## 2018-09-28 DIAGNOSIS — Z9889 Other specified postprocedural states: Secondary | ICD-10-CM

## 2018-09-28 NOTE — Progress Notes (Signed)
GYNECOLOGY  VISIT   HPI: 53 y.o.   Married  [place] American  female   [phone] with Patient's last menstrual period was 09/17/2018. here for 1 week follow up  TOTAL LAPAROSCOPIC HYSTERECTOMY WITH SALPINGECTOMY (Bilateral Abdomen) CYSTOSCOPY (N/A Bladder) LAPAROSCOPIC LYSIS OF ADHESIONS (N/A Abdomen).  Final pathology - benign polyps, fibroid, adenomyosis, benign cervix, benign tubes.   Hx DVT following tractor injury years ago. She is taking daily Lovenox for a total of 14 days post surgery.  She was able to fill the prescription with the assistance of a senator.  States she tried to take the Dilaudid, but she did not feel pain or anything at all. She also had constipation, but this has resolved with Dulcolax. She decided to stop the Dilaudid.  She is working from home currently post op. Usually she works 4 jobs.   She is tired.   Hot at night.   No vaginal bleeding.   GYNECOLOGIC HISTORY: Patient's last menstrual period was 09/17/2018. Contraception:  Hysterectomy Menopausal hormone therapy:  none Last mammogram: 09-15-18 3D Neg/density C/BiRads1 Last pap smear: 08-19-18 Neg:Neg HR HPV        OB History    Gravida  3   Para  2   Term  0   Preterm  0   AB  1   Living  2     SAB  1   TAB  0   Ectopic  0   Multiple  0   Live Births  0              Patient Active Problem List   Diagnosis Date Noted  . Abnormal uterine bleeding 09/22/2018  . Status post laparoscopic hysterectomy 09/22/2018  . Concussion with loss of consciousness 06/14/2015  . Chronic headache 06/14/2015  . Chest pain 04/14/2014  . Seizure disorder ([place]) 04/14/2014    Past Medical History:  Diagnosis Date  . Anemia   . Anxiety   . Back problem    PAIN WHEN LIFTING  . Chronic shoulder pain    BOTH SHOULDER HAVE ROTATOR CUFF TEAR  . Depression   . DVT (deep venous thrombosis) ([place]) 2016   related to injury with a tractor.  had left leg trauma.  . Eating disorder   . Hx of  obesity   . Hypertension   . Seizures ([place])    LAST SEIZURE YRS AGO    Past Surgical History:  Procedure Laterality Date  . CYSTOSCOPY N/A 09/22/2018   Procedure: CYSTOSCOPY;  Surgeon: Nunzio Cobbs, MD;  Location: [facility];  Service: Gynecology;  Laterality: N/A;  . KNEE SURGERY     RIGHT  . LAPAROSCOPIC LYSIS OF ADHESIONS N/A 09/22/2018   Procedure: LAPAROSCOPIC LYSIS OF ADHESIONS;  Surgeon: Nunzio Cobbs, MD;  Location: [facility];  Service: Gynecology;  Laterality: N/A;  . MULTIPLE TOOTH EXTRACTIONS    . TOTAL LAPAROSCOPIC HYSTERECTOMY WITH SALPINGECTOMY Bilateral 09/22/2018   Procedure: TOTAL LAPAROSCOPIC HYSTERECTOMY WITH SALPINGECTOMY;  Surgeon: Nunzio Cobbs, MD;  Location: [facility];  Service: Gynecology;  Laterality: Bilateral;  Possible BSO. Extended recovery bed needed.    Current Outpatient Medications  Medication Sig Dispense Refill  . alprazolam (XANAX) 2 MG tablet Take 2 mg by mouth daily as needed for anxiety.     . clonazePAM (KLONOPIN) 1 MG tablet Take 1 mg by mouth 3 (three) times daily as needed for anxiety.     Marland Kitchen  cyanocobalamin (,VITAMIN B-12,) 1000 MCG/ML injection Inject 1,000 mcg into the muscle every 30 (thirty) days.     . divalproex (DEPAKOTE ER) 500 MG 24 hr tablet Take 2 tablets (1,000 mg total) by mouth at bedtime. 60 tablet 11  . doxepin (SINEQUAN) 25 MG capsule Take 1 capsule (25 mg total) by mouth at bedtime. 14 capsule 0  . enoxaparin (LOVENOX) 40 MG/0.4ML injection Inject 0.4 mLs (40 mg total) into the skin daily. Inject daily for 13 more days. 13 Syringe 0  . lisinopril (PRINIVIL,ZESTRIL) 20 MG tablet Take 20 mg by mouth daily. RAN OUT OF BP MEDS 3 DAYS AGO    . Multiple Vitamins-Minerals (MULTIVITAMIN ADULT PO) Take 1 tablet by mouth 3 (three) times daily.     Marland Kitchen oxyCODONE-acetaminophen (PERCOCET) 10-325 MG tablet     . Phenytoin (DILANTIN PO) Take 500 mg by mouth 2  (two) times daily.    . QUEtiapine (SEROQUEL) 100 MG tablet Take 1 tablet (100 mg total) by mouth at bedtime. 30 tablet 6  . rizatriptan (MAXALT-MLT) 10 MG disintegrating tablet Take 1 tablet (10 mg total) by mouth as needed. May repeat in 2 hours if needed 15 tablet 6  . venlafaxine XR (EFFEXOR-XR) 75 MG 24 hr capsule Take 75 mg by mouth daily with breakfast.     . zolpidem (AMBIEN) 10 MG tablet Take 10 mg by mouth at bedtime as needed for sleep.      No current facility-administered medications for this visit.      ALLERGIES: Ibuprofen; Tramadol; Bee venom; Dilaudid [hydromorphone hcl]; Gabapentin; Naproxen; Penicillins; Tylenol [acetaminophen]; Vicodin [hydrocodone-acetaminophen]; and Toradol [ketorolac tromethamine]  Family History  Problem Relation Age of Onset  . Depression Mother   . Post-traumatic stress disorder Mother   . Mesothelioma Father   . Cancer Sister        cervical cancer 2010  . Breast cancer Maternal Aunt 40       breast cancer    Social History   Socioeconomic History  . Marital status: Married    Spouse name: Not on file  . Number of children: 2  . Years of education: Masters  . Highest education level: Not on file  Occupational History  . Occupation: [employer]  . Financial resource strain: Not on file  . Food insecurity:    Worry: Not on file    Inability: Not on file  . Transportation needs:    Medical: Not on file    Non-medical: Not on file  Tobacco Use  . Smoking status: Current Every Day Smoker    Packs/day: 1.00    Years: 20.00    Pack years: 20.00    Types: Cigarettes    Start date: 09/04/2007  . Smokeless tobacco: Never Used  Substance and Sexual Activity  . Alcohol use: No  . Drug use: Never  . Sexual activity: Not Currently    Birth control/protection: None  Lifestyle  . Physical activity:    Days per week: Not on file    Minutes per session: Not on file  . Stress: Not on file  Relationships  . Social connections:     Talks on phone: Not on file    Gets together: Not on file    Attends religious service: Not on file    Active member of club or organization: Not on file    Attends meetings of clubs or organizations: Not on file    Relationship status: Not on file  . Intimate  partner violence:    Fear of current or ex partner: Not on file    Emotionally abused: Not on file    Physically abused: Not on file    Forced sexual activity: Not on file  Other Topics Concern  . Not on file  Social History Narrative   Lives at home with husband.   Right-handed.   No caffeine use.    Review of Systems  All other systems reviewed and are negative.   PHYSICAL EXAMINATION:    BP 110/60 (BP Location: Left Arm, Patient Position: Sitting, Cuff Size: Normal)   Pulse 100   Ht 5' 6.25" (1.683 m)   Wt 122 lb 9.6 oz (55.6 kg)   LMP 09/17/2018   BMI 19.64 kg/m     General appearance: alert, cooperative and appears stated age   Abdomen: incisions intact. Dermabond is gone.  Abdomen is soft, non-tender, no masses,  no organomegaly   Pelvic: deferred.  Chaperone was present for exam.  ASSESSMENT  Status post laparoscopic hysterectomy with LOA, bilateral salpingectomy, cystoscopy.  Fatigue.  Hx DVT related to trauma.   PLAN  Will check CBC.  I recommend she does not work for 6 weeks. Note written today for her to give to her work.  No driving for 2 weeks post op.  Continue Lovenox to complete 14 day course.  I recommend some ambulation post op also.  We reviewed lifting limits. Return for 6 week check.    An After Visit Summary was printed and given to the patient.

## 2018-09-29 LAB — CBC
Hematocrit: 38 % (ref 34.0–46.6)
Hemoglobin: 13.1 g/dL (ref 11.1–15.9)
MCH: 27.2 pg (ref 26.6–33.0)
MCHC: 34.5 g/dL (ref 31.5–35.7)
MCV: 79 fL (ref 79–97)
Platelets: 408 10*3/uL (ref 150–450)
RBC: 4.82 x10E6/uL (ref 3.77–5.28)
RDW: 14.6 % (ref 11.7–15.4)
WBC: 8.2 10*3/uL (ref 3.4–10.8)

## 2018-09-30 NOTE — Telephone Encounter (Signed)
Assistance no longer required.  Encounter will be closed.

## 2018-09-30 NOTE — Telephone Encounter (Signed)
Shamika from [facility] called and stated she is returning Tracy's call.

## 2018-10-02 ENCOUNTER — Encounter: Payer: Self-pay | Admitting: Obstetrics and Gynecology

## 2018-10-02 ENCOUNTER — Ambulatory Visit (INDEPENDENT_AMBULATORY_CARE_PROVIDER_SITE_OTHER): Payer: BLUE CROSS/BLUE SHIELD | Admitting: Obstetrics and Gynecology

## 2018-10-02 ENCOUNTER — Telehealth: Payer: Self-pay | Admitting: Obstetrics and Gynecology

## 2018-10-02 VITALS — BP 100/60 | HR 88 | Temp 98.4°F | Resp 14 | Ht 66.25 in | Wt 129.0 lb

## 2018-10-02 DIAGNOSIS — N951 Menopausal and female climacteric states: Secondary | ICD-10-CM

## 2018-10-02 DIAGNOSIS — N939 Abnormal uterine and vaginal bleeding, unspecified: Secondary | ICD-10-CM

## 2018-10-02 NOTE — Patient Instructions (Addendum)
Consider Paxil, Gabapentin, or a Clonidine patch for nonhormonal treatment of menopausal symptoms.   Please contact your primary care provider to review these options and to receive a prescription if you desire.   You need 6 weeks of recovery time post op before you can return to work.  I will see you for your 6 week post op visit!

## 2018-10-02 NOTE — Telephone Encounter (Signed)
Patient would like to talk with Dr.Silva's nurse about her symptoms after surgery. She is 'having trouble and would like to a nurse ASAP".

## 2018-10-02 NOTE — Progress Notes (Signed)
GYNECOLOGY  VISIT   HPI: 53 y.o.   Married  [place] American  female   [phone] with Patient's last menstrual period was 09/17/2018.   here for bleeding post op/hot flashes. Patient had a fever Thursday and Friday (2/20-2/21).  States that her bleeding is faint.  No vaginal pain.  No dysuria.  BMs are still the same.  Did take Dulcolax for constipation.  Is eating more and eating more healthy.  Trying to stop smoking completely.  States her temperature was 101 F.  A nurse friend came to her house and states that her temperature is elevated at night.  Taking a cool shower to cool down.  Feels irritable.  Stopped Effexor years ago due to suicidal thoughts.   Still on Lovenox.  Hx prior DVT.   She went to a board meeting this week.  Really wants to be at work.  She is struggling with not going to work.   GYNECOLOGIC HISTORY: Patient's last menstrual period was 09/17/2018. Contraception:  Hysterectomy Menopausal hormone therapy:  none Last mammogram:  09-15-18 3D Neg/density C/BiRads1 Last pap smear:   08-19-18 Neg:Neg HR HPV        OB History    Gravida  3   Para  2   Term  0   Preterm  0   AB  1   Living  2     SAB  1   TAB  0   Ectopic  0   Multiple  0   Live Births  0              Patient Active Problem List   Diagnosis Date Noted  . Abnormal uterine bleeding 09/22/2018  . Status post laparoscopic hysterectomy 09/22/2018  . Concussion with loss of consciousness 06/14/2015  . Chronic headache 06/14/2015  . Chest pain 04/14/2014  . Seizure disorder ([place]) 04/14/2014    Past Medical History:  Diagnosis Date  . Anemia   . Anxiety   . Back problem    PAIN WHEN LIFTING  . Chronic shoulder pain    BOTH SHOULDER HAVE ROTATOR CUFF TEAR  . Depression   . DVT (deep venous thrombosis) ([place]) 2016   related to injury with a tractor.  had left leg trauma.  . Eating disorder   . Hx of obesity   . Hypertension   . Seizures ([place])    LAST SEIZURE YRS  AGO    Past Surgical History:  Procedure Laterality Date  . CYSTOSCOPY N/A 09/22/2018   Procedure: CYSTOSCOPY;  Surgeon: Nunzio Cobbs, MD;  Location: [facility];  Service: Gynecology;  Laterality: N/A;  . KNEE SURGERY     RIGHT  . LAPAROSCOPIC LYSIS OF ADHESIONS N/A 09/22/2018   Procedure: LAPAROSCOPIC LYSIS OF ADHESIONS;  Surgeon: Nunzio Cobbs, MD;  Location: [facility];  Service: Gynecology;  Laterality: N/A;  . MULTIPLE TOOTH EXTRACTIONS    . TOTAL LAPAROSCOPIC HYSTERECTOMY WITH SALPINGECTOMY Bilateral 09/22/2018   Procedure: TOTAL LAPAROSCOPIC HYSTERECTOMY WITH SALPINGECTOMY;  Surgeon: Nunzio Cobbs, MD;  Location: [facility];  Service: Gynecology;  Laterality: Bilateral;  Possible BSO. Extended recovery bed needed.    Current Outpatient Medications  Medication Sig Dispense Refill  . clonazePAM (KLONOPIN) 1 MG tablet Take 1 mg by mouth 3 (three) times daily as needed for anxiety.     . cyanocobalamin (,VITAMIN B-12,) 1000 MCG/ML injection Inject 1,000 mcg into the muscle every 30 (thirty)  days.     . divalproex (DEPAKOTE ER) 500 MG 24 hr tablet Take 2 tablets (1,000 mg total) by mouth at bedtime. 60 tablet 11  . doxepin (SINEQUAN) 25 MG capsule Take 1 capsule (25 mg total) by mouth at bedtime. 14 capsule 0  . enoxaparin (LOVENOX) 40 MG/0.4ML injection Inject 0.4 mLs (40 mg total) into the skin daily. Inject daily for 13 more days. 13 Syringe 0  . lisinopril (PRINIVIL,ZESTRIL) 20 MG tablet Take 20 mg by mouth daily. RAN OUT OF BP MEDS 3 DAYS AGO    . Multiple Vitamins-Minerals (MULTIVITAMIN ADULT PO) Take 1 tablet by mouth 3 (three) times daily.     Marland Kitchen Phenytoin (DILANTIN PO) Take 500 mg by mouth 2 (two) times daily.    . QUEtiapine (SEROQUEL) 100 MG tablet Take 1 tablet (100 mg total) by mouth at bedtime. 30 tablet 6  . rizatriptan (MAXALT-MLT) 10 MG disintegrating tablet Take 1 tablet (10 mg total) by  mouth as needed. May repeat in 2 hours if needed 15 tablet 6  . venlafaxine XR (EFFEXOR-XR) 75 MG 24 hr capsule Take 75 mg by mouth daily with breakfast.     . zolpidem (AMBIEN) 10 MG tablet Take 10 mg by mouth at bedtime as needed for sleep.     Marland Kitchen alprazolam (XANAX) 2 MG tablet Take 2 mg by mouth daily as needed for anxiety.      No current facility-administered medications for this visit.      ALLERGIES: Ibuprofen; Tramadol; Bee venom; Dilaudid [hydromorphone hcl]; Gabapentin; Naproxen; Penicillins; Tylenol [acetaminophen]; Vicodin [hydrocodone-acetaminophen]; and Toradol [ketorolac tromethamine]  Family History  Problem Relation Age of Onset  . Depression Mother   . Post-traumatic stress disorder Mother   . Mesothelioma Father   . Cancer Sister        cervical cancer 2010  . Breast cancer Maternal Aunt 40       breast cancer    Social History   Socioeconomic History  . Marital status: Married    Spouse name: Not on file  . Number of children: 2  . Years of education: Masters  . Highest education level: Not on file  Occupational History  . Occupation: [employer]  . Financial resource strain: Not on file  . Food insecurity:    Worry: Not on file    Inability: Not on file  . Transportation needs:    Medical: Not on file    Non-medical: Not on file  Tobacco Use  . Smoking status: Current Every Day Smoker    Packs/day: 1.00    Years: 20.00    Pack years: 20.00    Types: Cigarettes    Start date: 09/04/2007  . Smokeless tobacco: Never Used  Substance and Sexual Activity  . Alcohol use: No  . Drug use: Never  . Sexual activity: Not Currently    Birth control/protection: None  Lifestyle  . Physical activity:    Days per week: Not on file    Minutes per session: Not on file  . Stress: Not on file  Relationships  . Social connections:    Talks on phone: Not on file    Gets together: Not on file    Attends religious service: Not on file    Active member  of club or organization: Not on file    Attends meetings of clubs or organizations: Not on file    Relationship status: Not on file  . Intimate partner violence:  Fear of current or ex partner: Not on file    Emotionally abused: Not on file    Physically abused: Not on file    Forced sexual activity: Not on file  Other Topics Concern  . Not on file  Social History Narrative   Lives at home with husband.   Right-handed.   No caffeine use.    Review of Systems  Constitutional: Negative.   HENT: Negative.   Eyes: Negative.   Respiratory: Negative.   Cardiovascular: Negative.   Gastrointestinal: Negative.   Endocrine: Negative.   Genitourinary:       Vaginal spotting Hot flashes  Musculoskeletal: Negative.   Skin: Negative.   Allergic/Immunologic: Negative.   Neurological: Negative.   Hematological: Negative.   Psychiatric/Behavioral: Negative.     PHYSICAL EXAMINATION:    BP 100/60 (BP Location: Right Arm, Patient Position: Sitting, Cuff Size: Normal)   Pulse 88   Temp 98.4 F (36.9 C) (Oral)   Resp 14   Ht 5' 6.25" (1.683 m)   Wt 129 lb (58.5 kg)   LMP 09/17/2018   BMI 20.66 kg/m     General appearance: alert, cooperative and appears stated age   Abdomen: incisions intact.  Abdomen is soft, non-tender, no masses,  no organomegaly   Pelvic: External genitalia:  no lesions              Urethra:  normal appearing urethra with no masses, tenderness or lesions              Bartholins and Skenes: normal                 Vagina: normal appearing vagina with normal color and discharge, no lesions              Cervix:  Absent.  Cuff intact.  No erythema.  Minimal blood tinges drainage.                Bimanual Exam:  Uterus:  Absent.              Adnexa: no mass, fullness, tenderness          Chaperone was present for exam.  ASSESSMENT  Status post laparoscopic hysterectomy.  Healing well.  Vaginal bleeding post op.  Normal. Menopausal symptoms.   PLAN  We  discussed hot flashes and night sweats and menopausal changes.   Discussed clonidine patch, Paxil, and Gabapentin.  I would prefer for her PCP to prescribe due to her personal history of seizure disorder, depression/anxiety, and HTN.  I do not recommend she take estrogen therapy due to her history of DVT.  Brochure on menopause.  Continue no work for 6 weeks post op. FU for 6 week post op visit.   An After Visit Summary was printed and given to the patient.  __25____ minutes face to face time of which over 50% was spent in counseling.

## 2018-10-02 NOTE — Telephone Encounter (Signed)
Spoke with patient.  Vaginal spotting since last night. Notes blood on toilet tissue after voiding last night and today.  Also, for "a few days" feeling very hot at night. Takes a shower and feels improved. Has been walking outside in the cool weather and this helps. States her husband recommended she go to the ER last night and she refused.  Today, took a shower and feeling very hot. Has not taken temperature.  Office visit today at 1115 with Dr. Quincy Simmonds.  Encounter to Dr. Quincy Simmonds and will close.

## 2018-10-03 LAB — URINE CULTURE: Organism ID, Bacteria: NO GROWTH

## 2018-10-04 NOTE — Discharge Summary (Signed)
Physician Discharge Summary  Patient ID: Janet Cohen MRN: [id number] DOB/AGE: Oct 12, 1965 53 y.o.  Admit date: 09/22/2018 Discharge date: 09/23/18 Admission Diagnoses: 1.  Abnormal uterine bleeding. 2.  Pelvic pain. 3.  Uterine fibroids. 4.  Suspected adenomyosis.  5.  Chronic narcotic use. 6.  Tobacco use.  7.  Hx prior DVT.  Discharge Diagnoses:  1.  Abnormal uterine bleeding. 2.  Pelvic pain. 3.  Uterine fibroids. 4.  Suspected adenomyosis.  5.  Chronic narcotic use. 6.  Tobacco use. 7.  Hx prior DVT. 8.  Status post total laparoscopic hysterectomy with bilateral salpingectomy, lysis of adhesions, cystoscopy.    Active Problems:   Abnormal uterine bleeding   Status post laparoscopic hysterectomy   Discharged Condition: good  Hospital Course: The patient was admitted on 09/22/18 for a total laparoscopic hysterectomy with bilateral salpingectomy, lysis of adhesions, cystoscopy which were performed without complication while under general anesthesia.  The patient's post op course was uneventful.  She received Dilaudid IV for pain control initially, and this was converted over to oral Dilaudid when the patient began taking po well.  She ambulated independently and wore PAS and Ted hose for DVT prophylaxis while in bed.  She also received Lovenox pre and post op.  Her foley catheter were removed at the termination of her surgery, and she was able to void spontaneously post op. The patient's vital signs remained stable and she demonstrated low grade fever which was attributed to atelectasis and her smoking status.  A urine specimen was collected and sent for culture.  Ultimately, this was negative for infection.  The patient's post op day one Hgb was 10.4.  She was tolerating the this well.  Her low post op potassium of 2.7 was attributed to her preop bowel prep.  Her preop potassium was 3.8.  She had very minimal vaginal bleeding, and her incision(s) demonstrated no signs of erythema.   She did have some bloody drainage from the left lower quadrant incision, but this was found to be intact and treated well with a mild pressure dressing.  No hematoma or ecchymoses developed.  She was found to be in good condition and ready for discharge on post op day one.   Consults: None  Significant Diagnostic Studies: labs: See Hospital Course.  Treatments: surgery: Total laparoscopic hysterectomy with bilateral salpingectomy, lysis of adhesions, cystoscopy performed on 09/22/18.   Discharge Exam: Blood pressure 137/74, pulse 74, temperature 99.2 F (37.3 C), resp. rate 18, weight 56.6 kg, last menstrual period 09/17/2018, SpO2 100 %. General: alert and cooperative Resp: clear to auscultation bilaterally Cardio: regular rate and rhythm, S1, S2 normal, no murmur, click, rub or gallop GI: soft, non-tender; bowel sounds normal; no masses,  no organomegaly and incisions: clean, dry and intact Vaginal Bleeding: none  Disposition:  Discharge to home in good condition.   Instructions were reviewed in verbal and written form.   Patient will increase her intake of K through dietary forms of bananas and orange juice.   Allergies as of 09/23/2018      Reactions   Ibuprofen Anaphylaxis   Tramadol Hives   Bee Venom    Gabapentin Hives   Naproxen Hives   Penicillins Swelling   Did it involve swelling of the face/tongue/throat, SOB, or low BP? Yes Did it involve sudden or severe rash/hives, skin peeling, or any reaction on the inside of your mouth or nose? No Did you need to seek medical attention at a hospital or doctor's office? Yes  When did it last happen?3 years ago   Tylenol [acetaminophen] Nausea And Vomiting   Vicodin [hydrocodone-acetaminophen] Itching   Toradol [ketorolac Tromethamine] Other (See Comments)   Syncope per pt      Medication List    STOP taking these medications   megestrol 40 MG tablet Commonly known as:  MEGACE   norethindrone 5 MG tablet Commonly  known as:  AYGESTIN   oxyCODONE-acetaminophen 10-325 MG tablet Commonly known as:  PERCOCET     TAKE these medications   alprazolam 2 MG tablet Commonly known as:  XANAX Take 2 mg by mouth daily as needed for anxiety.   clonazePAM 1 MG tablet Commonly known as:  KLONOPIN Take 1 mg by mouth 3 (three) times daily as needed for anxiety.   cyanocobalamin 1000 MCG/ML injection Commonly known as:  (VITAMIN B-12) Inject 1,000 mcg into the muscle every 30 (thirty) days.   DILANTIN PO Take 500 mg by mouth 2 (two) times daily.   divalproex 500 MG 24 hr tablet Commonly known as:  DEPAKOTE ER Take 2 tablets (1,000 mg total) by mouth at bedtime.   doxepin 25 MG capsule Commonly known as:  SINEQUAN Take 1 capsule (25 mg total) by mouth at bedtime.   enoxaparin 40 MG/0.4ML injection Commonly known as:  LOVENOX Inject 0.4 mLs (40 mg total) into the skin daily. Inject daily for 13 more days.   lisinopril 20 MG tablet Commonly known as:  PRINIVIL,ZESTRIL Take 20 mg by mouth daily. RAN OUT OF BP MEDS 3 DAYS AGO   MULTIVITAMIN ADULT PO Take 1 tablet by mouth 3 (three) times daily.   QUEtiapine 100 MG tablet Commonly known as:  SEROQUEL Take 1 tablet (100 mg total) by mouth at bedtime.   rizatriptan 10 MG disintegrating tablet Commonly known as:  MAXALT-MLT Take 1 tablet (10 mg total) by mouth as needed. May repeat in 2 hours if needed   venlafaxine XR 75 MG 24 hr capsule Commonly known as:  EFFEXOR-XR Take 75 mg by mouth daily with breakfast.   zolpidem 10 MG tablet Commonly known as:  AMBIEN Take 10 mg by mouth at bedtime as needed for sleep.      Follow-up Information    Nunzio Cobbs, MD In 1 week.   Specialty:  Obstetrics and Gynecology Contact information: [address] [place] [address] [phone]           Signed: Arloa Koh 10/04/2018, 10:24 AM

## 2018-11-02 ENCOUNTER — Encounter: Payer: Self-pay | Admitting: Obstetrics and Gynecology

## 2018-11-02 ENCOUNTER — Ambulatory Visit (INDEPENDENT_AMBULATORY_CARE_PROVIDER_SITE_OTHER): Payer: BLUE CROSS/BLUE SHIELD | Admitting: Obstetrics and Gynecology

## 2018-11-02 ENCOUNTER — Other Ambulatory Visit: Payer: Self-pay

## 2018-11-02 ENCOUNTER — Ambulatory Visit: Payer: BLUE CROSS/BLUE SHIELD | Admitting: Obstetrics and Gynecology

## 2018-11-02 VITALS — BP 118/70 | HR 80 | Resp 14 | Ht 66.25 in | Wt 129.8 lb

## 2018-11-02 DIAGNOSIS — Z9889 Other specified postprocedural states: Secondary | ICD-10-CM

## 2018-11-02 NOTE — Progress Notes (Signed)
GYNECOLOGY  VISIT   HPI: 53 y.o.   Married  [place] American  female   [phone] with Patient's last menstrual period was 09/17/2018.   here for 6 week post op TOTAL LAPAROSCOPIC HYSTERECTOMY WITH SALPINGECTOMY (Bilateral Abdomen) CYSTOSCOPY (N/A Bladder) LAPAROSCOPIC LYSIS OF ADHESIONS (N/A Abdomen).   Surgery 09/22/18.   "I am miserable since you took my guts out."  States she went back to work on the third day after she was discharged from the hospital.  She admits she did not have decreased activity.  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   Did not take all of the Lovenox.  Has 3 injections remaining.   Hot, sweaty.  Not feeling good.  Not able to sleep in the same bed as her husband.  (Not sexually active for a long time due to health issues of partner.) Taking Effexor only prn.  States after she stopped her estrogen patch she started having hot flashes.   She states her PCP has not agreed to treat her with Clonidine, Paxil, and Gabapentin.   Trying to quit smoking.   GYNECOLOGIC HISTORY: Patient's last menstrual period was 09/17/2018. Contraception:  Hysterectomy Menopausal hormone therapy:  none Last mammogram:  09/15/18 BIRADS 1 negative/density c Last pap smear:   08/19/18 Neg:Neg HR HPV        OB History    Gravida  3   Para  2   Term  0   Preterm  0   AB  1   Living  2     SAB  1   TAB  0   Ectopic  0   Multiple  0   Live Births  0              Patient Active Problem List   Diagnosis Date Noted  . Abnormal uterine bleeding 09/22/2018  . Status post laparoscopic hysterectomy 09/22/2018  . Concussion with loss of consciousness 06/14/2015  . Chronic headache 06/14/2015  . Chest pain 04/14/2014  . Seizure disorder ([place]) 04/14/2014    Past Medical History:  Diagnosis Date  . Anemia   . Anxiety   . Back problem    PAIN WHEN LIFTING  . Chronic shoulder pain    BOTH SHOULDER HAVE ROTATOR CUFF TEAR  .  Depression   . DVT (deep venous thrombosis) ([place]) 2016   related to injury with a tractor.  had left leg trauma.  . Eating disorder   . Hx of obesity   . Hypertension   . Seizures ([place])    LAST SEIZURE YRS AGO    Past Surgical History:  Procedure Laterality Date  . CYSTOSCOPY N/A 09/22/2018   Procedure: CYSTOSCOPY;  Surgeon: Nunzio Cobbs, MD;  Location[facility];  Service: Gynecology;  Laterality: N/A;  . KNEE SURGERY     RIGHT  . LAPAROSCOPIC LYSIS OF ADHESIONS N/A 09/22/2018   Procedure: LAPAROSCOPIC LYSIS OF ADHESIONS;  Surgeon: Nunzio Cobbs, MD;  Location: [facility];  Service: Gynecology;  Laterality: N/A;  . MULTIPLE TOOTH EXTRACTIONS    . TOTAL LAPAROSCOPIC HYSTERECTOMY WITH SALPINGECTOMY Bilateral 09/22/2018   Procedure: TOTAL LAPAROSCOPIC HYSTERECTOMY WITH SALPINGECTOMY;  Surgeon: Nunzio Cobbs, MD;  Location: [facility];  Service: Gynecology;  Laterality: Bilateral;  Possible BSO. Extended recovery bed needed.    Current Outpatient Medications  Medication Sig Dispense Refill  . alprazolam (XANAX) 2 MG  tablet Take 2 mg by mouth daily as needed for anxiety.     . CHANTIX STARTING MONTH PAK 0.5 MG X 11 & 1 MG X 42 tablet TAKE AS DIRECTED PER PACKAGE    . clonazePAM (KLONOPIN) 1 MG tablet Take 1 mg by mouth 3 (three) times daily as needed for anxiety.     . cyanocobalamin (,VITAMIN B-12,) 1000 MCG/ML injection Inject 1,000 mcg into the muscle every 30 (thirty) days.     . divalproex (DEPAKOTE ER) 500 MG 24 hr tablet Take 2 tablets (1,000 mg total) by mouth at bedtime. 60 tablet 11  . doxepin (SINEQUAN) 25 MG capsule Take 1 capsule (25 mg total) by mouth at bedtime. 14 capsule 0  . lisinopril (PRINIVIL,ZESTRIL) 20 MG tablet Take 20 mg by mouth daily. RAN OUT OF BP MEDS 3 DAYS AGO    . megestrol (MEGACE) 40 MG tablet Take 40 mg by mouth 3 (three) times daily.    . Multiple Vitamins-Minerals  (MULTIVITAMIN ADULT PO) Take 1 tablet by mouth 3 (three) times daily.     Marland Kitchen oxyCODONE-acetaminophen (PERCOCET) 10-325 MG tablet as needed.    Marland Kitchen Phenytoin (DILANTIN PO) Take 500 mg by mouth 2 (two) times daily.    . QUEtiapine (SEROQUEL) 100 MG tablet Take 1 tablet (100 mg total) by mouth at bedtime. 30 tablet 6  . venlafaxine XR (EFFEXOR-XR) 75 MG 24 hr capsule Take 75 mg by mouth daily with breakfast.     . zolpidem (AMBIEN) 10 MG tablet Take 10 mg by mouth at bedtime as needed for sleep.     Marland Kitchen enoxaparin (LOVENOX) 40 MG/0.4ML injection Inject 0.4 mLs (40 mg total) into the skin daily. Inject daily for 13 more days. (Patient not taking: Reported on 11/02/2018) 13 Syringe 0  . rizatriptan (MAXALT-MLT) 10 MG disintegrating tablet Take 1 tablet (10 mg total) by mouth as needed. May repeat in 2 hours if needed (Patient not taking: Reported on 11/02/2018) 15 tablet 6   No current facility-administered medications for this visit.      ALLERGIES: Ibuprofen; Tramadol; Bee venom; Dilaudid [hydromorphone hcl]; Gabapentin; Naproxen; Penicillins; Tylenol [acetaminophen]; Vicodin [hydrocodone-acetaminophen]; and Toradol [ketorolac tromethamine]  Family History  Problem Relation Age of Onset  . Depression Mother   . Post-traumatic stress disorder Mother   . Mesothelioma Father   . Cancer Sister        cervical cancer 2010  . Breast cancer Maternal Aunt 40       breast cancer    Social History   Socioeconomic History  . Marital status: Married    Spouse name: Not on file  . Number of children: 2  . Years of education: Masters  . Highest education level: Not on file  Occupational History  . Occupation: [employer]  . Financial resource strain: Not on file  . Food insecurity:    Worry: Not on file    Inability: Not on file  . Transportation needs:    Medical: Not on file    Non-medical: Not on file  Tobacco Use  . Smoking status: Current Every Day Smoker    Packs/day: 1.00     Years: 20.00    Pack years: 20.00    Types: Cigarettes    Start date: 09/04/2007  . Smokeless tobacco: Never Used  Substance and Sexual Activity  . Alcohol use: No  . Drug use: Never  . Sexual activity: Not Currently    Birth control/protection: None  Lifestyle  .  Physical activity:    Days per week: Not on file    Minutes per session: Not on file  . Stress: Not on file  Relationships  . Social connections:    Talks on phone: Not on file    Gets together: Not on file    Attends religious service: Not on file    Active member of club or organization: Not on file    Attends meetings of clubs or organizations: Not on file    Relationship status: Not on file  . Intimate partner violence:    Fear of current or ex partner: Not on file    Emotionally abused: Not on file    Physically abused: Not on file    Forced sexual activity: Not on file  Other Topics Concern  . Not on file  Social History Narrative   Lives at home with husband.   Right-handed.   No caffeine use.    Review of Systems  Constitutional: Negative.   HENT: Negative.   Eyes: Negative.   Respiratory: Negative.   Cardiovascular: Negative.   Gastrointestinal: Negative.   Endocrine: Negative.   Genitourinary: Negative.   Musculoskeletal: Negative.   Skin: Negative.   Allergic/Immunologic: Negative.   Neurological: Negative.   Hematological: Negative.   Psychiatric/Behavioral: Negative.     PHYSICAL EXAMINATION:    BP 118/70 (BP Location: Right Arm, Patient Position: Sitting, Cuff Size: Normal)   Pulse 80   Resp 14   Ht 5' 6.25" (1.683 m)   Wt 129 lb 12.8 oz (58.9 kg)   LMP 09/17/2018   BMI 20.79 kg/m     General appearance: alert, cooperative and appears stated age   Abdomen: incisions well healed.  Abdomen is soft, non-tender, no masses,  no organomegaly   Pelvic: External genitalia:  no lesions              Urethra:  normal appearing urethra with no masses, tenderness or lesions               Bartholins and Skenes: normal                 Vagina:  Suture line is present.               Cervix: absent                Bimanual Exam:  Uterus:   absent              Adnexa: no mass, fullness, tenderness               Chaperone was present for exam.  ASSESSMENT  Status post laparoscopic hysterectomy with bilateral salpingectomy.  Ovaries remain.  Vaginal cuff not completely healed yet.  Menopausal symptoms since discontinuation of estrogen therapy given through her PCP.  Hx DVT.   PLAN  We reviewed that she should not be doing heavy lifting over 15 pounds at this time.  I told her the repercussion of this is vaginal vault dehiscence.  I gave her a note for work to have restrictions for the next month.  We reviewed risk of ERT including DVT, PE, and death if she were to use this because of her history of prior DVT.  I educated her about Effexor and that if used daily, it would treat her hot flashes, night sweats, and treat stress/worry/depression.  She will follow up in one month.  She expressed appreciation for the consultation and education today.   An After  Visit Summary was printed and given to the patient.

## 2018-12-03 ENCOUNTER — Ambulatory Visit: Payer: Self-pay | Admitting: Obstetrics and Gynecology

## 2019-01-21 ENCOUNTER — Other Ambulatory Visit: Payer: Self-pay | Admitting: *Deleted

## 2019-01-21 DIAGNOSIS — Z20822 Contact with and (suspected) exposure to covid-19: Secondary | ICD-10-CM

## 2019-01-26 LAB — NOVEL CORONAVIRUS, NAA: SARS-CoV-2, NAA: NOT DETECTED

## 2019-01-26 LAB — SPECIMEN STATUS REPORT

## 2019-02-12 ENCOUNTER — Other Ambulatory Visit: Payer: BLUE CROSS/BLUE SHIELD

## 2019-02-12 ENCOUNTER — Other Ambulatory Visit: Payer: Self-pay

## 2019-02-12 DIAGNOSIS — Z20822 Contact with and (suspected) exposure to covid-19: Secondary | ICD-10-CM

## 2019-02-19 LAB — NOVEL CORONAVIRUS, NAA: SARS-CoV-2, NAA: NOT DETECTED

## 2019-02-26 ENCOUNTER — Other Ambulatory Visit: Payer: Self-pay

## 2019-02-26 ENCOUNTER — Other Ambulatory Visit: Payer: BC Managed Care – PPO

## 2019-02-26 DIAGNOSIS — Z20822 Contact with and (suspected) exposure to covid-19: Secondary | ICD-10-CM

## 2019-03-01 LAB — NOVEL CORONAVIRUS, NAA: SARS-CoV-2, NAA: NOT DETECTED

## 2019-03-10 ENCOUNTER — Other Ambulatory Visit: Payer: Self-pay

## 2019-03-10 DIAGNOSIS — Z20822 Contact with and (suspected) exposure to covid-19: Secondary | ICD-10-CM

## 2019-03-11 LAB — NOVEL CORONAVIRUS, NAA: SARS-CoV-2, NAA: NOT DETECTED

## 2020-05-09 ENCOUNTER — Other Ambulatory Visit: Payer: Self-pay | Admitting: Family Medicine

## 2020-05-09 DIAGNOSIS — R4020 Unspecified coma: Secondary | ICD-10-CM

## 2020-05-09 DIAGNOSIS — S0990XS Unspecified injury of head, sequela: Secondary | ICD-10-CM

## 2020-05-23 ENCOUNTER — Ambulatory Visit
Admission: RE | Admit: 2020-05-23 | Discharge: 2020-05-23 | Disposition: A | Discharge status: Home / Self Care | Payer: 59 | Source: Ambulatory Visit | Attending: Family Medicine | Admitting: Family Medicine

## 2020-05-23 DIAGNOSIS — S0990XS Unspecified injury of head, sequela: Secondary | ICD-10-CM

## 2020-05-23 DIAGNOSIS — R4020 Unspecified coma: Secondary | ICD-10-CM

## 2020-07-24 ENCOUNTER — Other Ambulatory Visit: Payer: Self-pay | Admitting: Family Medicine

## 2020-07-24 ENCOUNTER — Ambulatory Visit
Admission: RE | Admit: 2020-07-24 | Discharge: 2020-07-24 | Disposition: A | Discharge status: Home / Self Care | Payer: 59 | Source: Ambulatory Visit | Attending: Family Medicine | Admitting: Family Medicine

## 2020-07-24 DIAGNOSIS — Z9289 Personal history of other medical treatment: Secondary | ICD-10-CM

## 2020-09-19 ENCOUNTER — Encounter: Payer: Self-pay | Admitting: Neurology

## 2020-09-25 ENCOUNTER — Other Ambulatory Visit: Payer: Self-pay | Admitting: Family Medicine

## 2020-09-25 DIAGNOSIS — M6281 Muscle weakness (generalized): Secondary | ICD-10-CM

## 2020-09-25 DIAGNOSIS — G40509 Epileptic seizures related to external causes, not intractable, without status epilepticus: Secondary | ICD-10-CM

## 2020-09-25 DIAGNOSIS — R55 Syncope and collapse: Secondary | ICD-10-CM

## 2020-10-04 ENCOUNTER — Other Ambulatory Visit: Payer: Self-pay

## 2020-10-04 ENCOUNTER — Encounter: Payer: Self-pay | Admitting: Neurology

## 2020-10-04 ENCOUNTER — Ambulatory Visit (INDEPENDENT_AMBULATORY_CARE_PROVIDER_SITE_OTHER): Payer: 59 | Admitting: Neurology

## 2020-10-04 VITALS — BP 148/87 | HR 88 | Ht 67.0 in | Wt 150.8 lb

## 2020-10-04 DIAGNOSIS — R413 Other amnesia: Secondary | ICD-10-CM | POA: Diagnosis not present

## 2020-10-04 DIAGNOSIS — R404 Transient alteration of awareness: Secondary | ICD-10-CM

## 2020-10-04 NOTE — Patient Instructions (Signed)
1. Schedule open MRI brain with and without contrast  2. Schedule Neurocognitive testing  3. Proceed with CT angiogram and EEG scheduled on March 11  4. Follow-up after tests, call for any changes

## 2020-10-04 NOTE — Progress Notes (Signed)
NEUROLOGY CONSULTATION NOTE  GIAVONNI CIZEK MRN: [id number] DOB: 09/03/1965  Referring provider: Dr. Lin Landsman Primary care provider: Dr. Lin Landsman  Reason for consult:  seizures  Dear Dr Ayesha Rumpf:  Thank you for your kind referral of Janet Cohen for consultation of the above symptoms. Although her history is well known to you, please allow me to reiterate it for the purpose of our medical record. She is alone in the office today. Records and images were personally reviewed where available.   HISTORY OF PRESENT ILLNESS: This is a 55 year old right-handed woman with a history of hypertension, ADHD, anxiety, neck and back pain, seizures since childhood, presenting after an episode of loss of consicousness on 09/12/20. She came home from work and had sudden pain and weakness in her left arm, dropping a bowl and falling to the ground. She was alone and unsure how long she was out, she was not able to use her left hand to pull herself up when she woke up and found she had urinary incontinence. She had significant pain in the left arm and shoulder, went to bed and slept. The next day she went to work and when she got home, she noticed her speech was slurred or "off," confirmed by a friend she called. She developed a headache and eyes truend gray. Left arm pain and weakness continued.  She started noticing left arm weakness 2 months ago, she would drop things and would get very irritated because she was dropping food, her arm and leg would go numb. She noticed she was walking funny, talking funny. She recalls that day she was at the computer studying for an exam and get very upset and irritated because she should know this information from doing the same job for 30 years. She reports dizziness, fatigue, blurred vision, light sensitivity.   Seizures started in her 39s when she had a seizure during delivery of her first child. She was told she had grand mal seizures where she stops breathing and  would have 3 in a row. She recalls taking Depakote in the past. She did well for several years, she fell out of a tractor in 2017 and was unconscious with injuries on her left arm and face, but she went back to work the next day and continued to work 4 jobs taking care of her husband with no seizures until she found her husband dead at their home in 06/30/19 and the seizures started up. She endorses a significant amount of stress and is tearful and intermittently agitated during today's visit. Seizures start with blurred vision, then she has a very bad headache where she has to hold on to things and gets a little confused and disoriented. She would then have a seizure described by witnesses as "flooping like a fish on dry land." When she wakes up, she feels a sense of relief from her body being so tense. She would have dizziness and confusion, falling on her porch. She had hit her head several times on cement with no loss of consciousness. The last seizure was 4 weeks ago, she got irritated at her new job trying to learn new information, and she started getting more upset because she could not comprehend it. She came across some of her husband's things and was crying so hard, the next thing she knew she was waking up in the yard with neighbors helping her, she had urinary incontinence and was told she flopped like a fish on dry  land. She is on Depakote ER 1000mg  qhs. She states she is definitely taking the Depakote, but does not take her Dilantin all the time, making her feel sleey or drugged in the morning. Dilantin is not on her medication list.   She reports that ever since her husband died, it's like her memory is gone. She has been working as a substance abuse counselor for 30 years, she has a Conservator, museum/gallery and loves to learn, but finds she cannot comprehend information. She has to read something 3-4 times but still forgets, making her irritated. She has to write note cards. 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 This last occurred a month ago. She notes this has happened other times, always when she gets angry and agitated. She became tearful saying it is so important she takes her test, she knows it front and back and can hear the information but her brain comprehends it in a different way, "something so simple I make so complex." She has been told by Dr. Clotilde Dieter and Sharyn Lull (an NP friend) that "this is not you, you're not yourself." She has been seeing a grief therapist. Sleep is fine but she feels tired and word out. She has mood swings and depression, she does not see a psychiatrist. She reports Dr. Clotilde Dieter is thinking she is having TIAs. She is scheduled for a CTA head and EEG on 10/13/20.   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 She has had several head injuries from the tractor injury, falling off horses. She had a normal birth and early development.  There is no history of febrile convulsions, CNS infections such as meningitis/encephalitis, significant traumatic brain injury, neurosurgical procedures   PAST MEDICAL HISTORY: Past Medical History:  Diagnosis Date  . Anemia   . Anxiety   . Back problem    PAIN WHEN LIFTING  . Chronic shoulder pain    BOTH SHOULDER HAVE ROTATOR CUFF TEAR  . Depression   . DVT (deep venous thrombosis) ([place]) 2016   related to injury with a tractor.  had left leg trauma.  . Eating disorder   . Hx of obesity   . Hypertension   . Seizures ([place])    LAST SEIZURE YRS AGO    PAST SURGICAL HISTORY: Past Surgical History:  Procedure Laterality Date  . CYSTOSCOPY N/A 09/22/2018    Procedure: CYSTOSCOPY;  Surgeon: Nunzio Cobbs, MD;  Location: [facility];  Service: Gynecology;  Laterality: N/A;  . KNEE SURGERY     RIGHT  . LAPAROSCOPIC LYSIS OF ADHESIONS N/A 09/22/2018   Procedure: LAPAROSCOPIC LYSIS OF ADHESIONS;  Surgeon: Nunzio Cobbs, MD;  [facility];  Service: Gynecology;  Laterality: N/A;  . MULTIPLE TOOTH EXTRACTIONS    . TOTAL LAPAROSCOPIC HYSTERECTOMY WITH SALPINGECTOMY Bilateral 09/22/2018   Procedure: TOTAL LAPAROSCOPIC HYSTERECTOMY WITH SALPINGECTOMY;  Surgeon: Nunzio Cobbs, MD;  Location: [facility];  Service: Gynecology;  Laterality: Bilateral;  Possible BSO. Extended recovery bed needed.    MEDICATIONS: Current Outpatient Medications on File Prior to Visit  Medication Sig Dispense Refill  . alprazolam (XANAX) 2 MG tablet Take 2 mg  by mouth daily as needed for anxiety.     . CHANTIX STARTING MONTH PAK 0.5 MG X 11 & 1 MG X 42 tablet TAKE AS DIRECTED PER PACKAGE    . clonazePAM (KLONOPIN) 1 MG tablet Take 1 mg by mouth 3 (three) times daily as needed for anxiety.     . cyanocobalamin (,VITAMIN B-12,) 1000 MCG/ML injection Inject 1,000 mcg into the muscle every 30 (thirty) days.     . divalproex (DEPAKOTE ER) 500 MG 24 hr tablet Take 2 tablets (1,000 mg total) by mouth at bedtime. 60 tablet 11  . doxepin (SINEQUAN) 25 MG capsule Take 1 capsule (25 mg total) by mouth at bedtime. 14 capsule 0  . enoxaparin (LOVENOX) 40 MG/0.4ML injection Inject 0.4 mLs (40 mg total) into the skin daily. Inject daily for 13 more days. (Patient not taking: Reported on 11/02/2018) 13 Syringe 0  . lisinopril (PRINIVIL,ZESTRIL) 20 MG tablet Take 20 mg by mouth daily. RAN OUT OF BP MEDS 3 DAYS AGO    . megestrol (MEGACE) 40 MG tablet Take 40 mg by mouth 3 (three) times daily.    . Multiple Vitamins-Minerals (MULTIVITAMIN ADULT PO) Take 1 tablet by mouth 3 (three) times daily.     Marland Kitchen  oxyCODONE-acetaminophen (PERCOCET) 10-325 MG tablet as needed.    Marland Kitchen Phenytoin (DILANTIN PO) Take 500 mg by mouth 2 (two) times daily.    . QUEtiapine (SEROQUEL) 100 MG tablet Take 1 tablet (100 mg total) by mouth at bedtime. 30 tablet 6  . rizatriptan (MAXALT-MLT) 10 MG disintegrating tablet Take 1 tablet (10 mg total) by mouth as needed. May repeat in 2 hours if needed (Patient not taking: Reported on 11/02/2018) 15 tablet 6  . venlafaxine XR (EFFEXOR-XR) 75 MG 24 hr capsule Take 75 mg by mouth daily with breakfast.     . zolpidem (AMBIEN) 10 MG tablet Take 10 mg by mouth at bedtime as needed for sleep.      No current facility-administered medications on file prior to visit.    ALLERGIES: Allergies  Allergen Reactions  . Ibuprofen Anaphylaxis  . Tramadol Hives  . Bee Venom   . Dilaudid [Hydromorphone Hcl] Other (See Comments)    Made patient extremely "Hyper" like she was superwoman  . Gabapentin Hives  . Naproxen Hives  . Penicillins Swelling    Did it involve swelling of the face/tongue/throat, SOB, or low BP? Yes Did it involve sudden or severe rash/hives, skin peeling, or any reaction on the inside of your mouth or nose? No Did you need to seek medical attention at a hospital or doctor's office? Yes When did it last happen?3 years ago   . Tylenol [Acetaminophen] Nausea And Vomiting  . Vicodin [Hydrocodone-Acetaminophen] Itching  . Toradol [Ketorolac Tromethamine] Other (See Comments)    Syncope per pt    FAMILY HISTORY: Family History  Problem Relation Age of Onset  . Depression Mother   . Post-traumatic stress disorder Mother   . Mesothelioma Father   . Cancer Sister        cervical cancer 2010  . Breast cancer Maternal Aunt 40       breast cancer    SOCIAL HISTORY: Social History   Socioeconomic History  . Marital status: Married    Spouse name: Not on file  . Number of children: 2  . Years of education: Masters  . Highest education level: Not on file   Occupational History  . Occupation: Farmer  Tobacco Use  . Smoking  status: Current Every Day Smoker    Packs/day: 1.00    Years: 20.00    Pack years: 20.00    Types: Cigarettes    Start date: 09/04/2007  . Smokeless tobacco: Never Used  Vaping Use  . Vaping Use: Never used  Substance and Sexual Activity  . Alcohol use: No  . Drug use: Never  . Sexual activity: Not Currently    Birth control/protection: None  Other Topics Concern  . Not on file  Social History Narrative   Lives at home with husband.   Right-handed.   No caffeine use.   Social Determinants of Health   Financial Resource Strain: Not on file  Food Insecurity: Not on file  Transportation Needs: Not on file  Physical Activity: Not on file  Stress: Not on file  Social Connections: Not on file  Intimate Partner Violence: Not on file     PHYSICAL EXAM: Vitals:   10/04/20 1251  BP: (!) 148/87  Pulse: 88  SpO2: 99%   General: No acute distress Head:  Normocephalic/atraumatic Skin/Extremities: No rash, no edema Neurological Exam: Mental status: alert and oriented to person, place, and time, no dysarthria or aphasia, Fund of knowledge is appropriate.  Recent and remote memory are intact.  Attention and concentration are normal.    Able to name objects and repeat phrases. Cranial nerves: CN I: not tested CN II: pupils equal, round and reactive to light, visual fields intact CN III, IV, VI:  full range of motion, no nystagmus, no ptosis CN V: facial sensation intact CN VII: upper and lower face symmetric CN VIII: hearing intact to conversation Bulk & Tone: normal, no fasciculations. Motor: 5/5 throughout with some giveway weakness on left arm, she reports left arm rotator cuff issues. . Sensation: decreased pin, cold on left UE and LE.  Romberg test negative Deep Tendon Reflexes: +2 throughout Cerebellar: no incoordination on finger to nose testing Gait: narrow-based and steady, no ataxia Tremor:  none   IMPRESSION: This is a 55 year old right-handed woman with a history of hypertension, ADHD, anxiety, neck and back pain, seizures since childhood, presenting after an episode of loss of consciousness on 09/12/20. She reports being seizure-free for many years until her husband passed away in 11-20-2018, since then seizures have occurred during times when she is very angry and agitated. She also reports memory loss and is very concerned about passing a test, she has difficulties learning new information. She is scheduled for a CTA head and neck and an EEG on 10/13/20. Discussed also doing an MRI brain with and without contrast and Neuropsychological evaluation. We discussed different causes of memory loss, including depression. Encouraged patient to see Psychiatry in addition to psychotherapy. [place] driving laws were discussed with the patient, and she knows to stop driving after an episode of loss of consciousness until 6 months event-free. Follow-up after tests, call for any changes.   Thank you for allowing me to participate in the care of this patient. Please do not hesitate to call for any questions or concerns.   Ellouise Newer, M.D.  CC: Dr. Ayesha Rumpf

## 2020-10-13 ENCOUNTER — Ambulatory Visit
Admission: RE | Admit: 2020-10-13 | Discharge: 2020-10-13 | Disposition: A | Discharge status: Home / Self Care | Payer: 59 | Source: Ambulatory Visit | Attending: Family Medicine | Admitting: Family Medicine

## 2020-10-13 ENCOUNTER — Ambulatory Visit (HOSPITAL_COMMUNITY)
Admission: RE | Admit: 2020-10-13 | Discharge: 2020-10-13 | Disposition: A | Discharge status: Home / Self Care | Payer: 59 | Source: Ambulatory Visit | Attending: Neurology | Admitting: Neurology

## 2020-10-13 ENCOUNTER — Other Ambulatory Visit: Payer: Self-pay

## 2020-10-13 DIAGNOSIS — R55 Syncope and collapse: Secondary | ICD-10-CM | POA: Diagnosis not present

## 2020-10-13 DIAGNOSIS — M6281 Muscle weakness (generalized): Secondary | ICD-10-CM

## 2020-10-13 DIAGNOSIS — R569 Unspecified convulsions: Secondary | ICD-10-CM | POA: Diagnosis present

## 2020-10-13 DIAGNOSIS — G40509 Epileptic seizures related to external causes, not intractable, without status epilepticus: Secondary | ICD-10-CM

## 2020-10-13 MED ORDER — IOPAMIDOL (ISOVUE-370) INJECTION 76%
75.0000 mL | Freq: Once | INTRAVENOUS | Status: AC | PRN
  Administered 2020-10-13: 75 mL via INTRAVENOUS

## 2020-10-13 NOTE — Progress Notes (Signed)
EEG complete - results pending 

## 2020-10-13 NOTE — Procedures (Signed)
Patient Name: Janet Cohen  MRN: [id number]  Epilepsy Attending: Lora Havens  Referring Physician/Provider: Dr Lin Landsman Date: 10/13/2020 Duration: 32.34 mins  Patient history: 55yo F with an episode of LOC. EEG to evaluate for seizure  Level of alertness: Awake, asleep  AEDs during EEG study: None  Technical aspects: This EEG study was done with scalp electrodes positioned according to the 10-20 International system of electrode placement. Electrical activity was acquired at a sampling rate of 500Hz  and reviewed with a high frequency filter of 70Hz  and a low frequency filter of 1Hz . EEG data were recorded continuously and digitally stored.   Description: The posterior dominant rhythm consists of 8-9 Hz activity of moderate voltage (25-35 uV) seen predominantly in posterior head regions, symmetric and reactive to eye opening and eye closing. Sleep was characterized by vertex waves, sleep spindles (12 to 14 Hz), maximal frontocentral region.  Physiologic photic driving was seen during photic stimulation.  Hyperventilation was not performed.     IMPRESSION: This study is within normal limits. No seizures or epileptiform discharges were seen throughout the recording.  Chaos Carlile Barbra Sarks

## 2020-11-28 ENCOUNTER — Ambulatory Visit: Payer: BC Managed Care – PPO

## 2020-11-28 ENCOUNTER — Other Ambulatory Visit: Payer: Self-pay

## 2020-11-28 ENCOUNTER — Encounter: Payer: Self-pay | Admitting: Counselor

## 2020-11-28 ENCOUNTER — Ambulatory Visit (INDEPENDENT_AMBULATORY_CARE_PROVIDER_SITE_OTHER): Payer: BC Managed Care – PPO | Admitting: Counselor

## 2020-11-28 DIAGNOSIS — F09 Unspecified mental disorder due to known physiological condition: Secondary | ICD-10-CM | POA: Diagnosis not present

## 2020-11-28 DIAGNOSIS — Z9149 Other personal history of psychological trauma, not elsewhere classified: Secondary | ICD-10-CM

## 2020-11-28 DIAGNOSIS — F4381 Prolonged grief disorder: Secondary | ICD-10-CM

## 2020-11-28 DIAGNOSIS — F4329 Adjustment disorder with other symptoms: Secondary | ICD-10-CM

## 2020-11-28 DIAGNOSIS — R404 Transient alteration of awareness: Secondary | ICD-10-CM
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
## 2020-11-28 NOTE — Progress Notes (Signed)
   Psychometrist Note   Cognitive testing was administered to Janet Cohen by Lamar Benes, B.S. (Technician) under the supervision of Alphonzo Severance, Psy.D., ABN. [name] was able to tolerate all test procedures. Dr. Nicole Kindred met with the patient as needed to manage any emotional reactions to the testing procedures. Rest breaks were offered.    The battery of tests administered was selected by Dr. Nicole Kindred with consideration to the patient's current level of functioning, the nature of her symptoms, emotional and behavioral responses during the interview, level of literacy, observed level of motivation/effort, and the nature of the referral question. This battery was communicated to the psychometrist. Communication between Dr. Nicole Kindred and the psychometrist was ongoing throughout the evaluation and Dr. Nicole Kindred was immediately accessible at all times. Dr. Nicole Kindred provided supervision to the technician on the date of this service, to the extent necessary to assure the quality of all services provided.    Ms. Glaab will return in approximately one week for an interactive feedback session with Dr. Nicole Kindred, at which time test performance, clinical impressions, and treatment recommendations will be reviewed in detail. The patient understands she can contact our office should she require our assistance before this time.   A total of 125 minutes of billable time were spent with Joleene Desma Cohen by the technician, including test administration and scoring time. Billing for these services is reflected in Dr. Les Pou note.   This note reflects time spent with the psychometrician and does not include test scores, clinical history, or any interpretations made by Dr. Nicole Kindred. The full report will follow in a separate note.

## 2020-11-28 NOTE — Progress Notes (Signed)
[facility]  Patient Name: Janet Cohen MRN: [id number] Date of Birth: [birth year] Age: 55 y.o. Education: 18 years  Measurement properties of test scores: IQ, Index, and Standard Scores (SS): Mean = 100; Standard Deviation = 15 Scaled Scores (Ss): Mean = 10; Standard Deviation = 3 Z scores (Z): Mean = 0; Standard Deviation = 1 T scores (T); Mean = 50; Standard Deviation = 10  TEST SCORES:    Note: This summary of test scores accompanies the interpretive report and should not be interpreted by unqualified individuals or in isolation without reference to the report. Test scores are relative to age, gender, and educational history as available and appropriate.   Performance Validity        ACS: Raw Descriptor      Word Choice: 45 Within Expectation      MSVT: Raw Descriptor      IR 95 Within Expectation      DR 95 Within Expectation      CNS 90 Within Expectation      PA 80 ---      FR 50 ---      The Dot Counting Test: Raw Descriptor      E-Score 11 Within Expectation      Embedded Measures: Raw Descriptor      RBANS Effort Index: 3 Within Expectation      WAIS-IV Reliable Digit Span 7 Within Expectation      WAIS-IV Reliable Digit Span Revised 11 Within Expectation      Mental Status Screening     Total Score Descriptor  MoCA 20 MCI      Expected Functioning        Wide Range Achievement Test (Word Reading): Standard/Scaled Score Percentile       Word Reading 88 21      Reynolds Intellectual Screening Test Standard/T-score Percentile      Guess What 31 3      Odd Item Out 46 34  RIST Index 85 16      Cognitive Testing        RBANS, Form : Standard/Scaled Score Percentile  Total Score 62 1  Immediate Memory 53 <1      List Learning 3 1      Story Memory 2 <1  Visuospatial/Constructional 81 10      Figure Copy   (17) 8 25      Judgment of Line Orientation   (12) --- 3-9  Language 87 19      Picture Naming ---  51-75      Semantic Fluency 6 9  Attention 82 12      Digit Span 6 9      Coding 8 25  Delayed Memory 48 <1      List Recall   (0) --- <2      List Recognition   (14) --- <2      Story Recall   (1) 1 <1      Figure Recall   (7) 5 5      Wechsler Adult Intelligence Scale - IV: Standard/Scaled Score Percentile  Working Memory Index 86 18      Digit Span 8 25          Digit Span Forward 8 25          Digit Span Backward 8 25          Digit Span Sequencing 9 37      Arithmetic  7 16  Processing Speed Index 92 30      Symbol Search 7 16      Coding 10 50      Neuropsychological Assessment Battery (Language Module): T-score Percentile      Naming   (30) 52 58      Verbal Fluency: T-score Percentile      Controlled Oral Word Association (F-A-S) 43 25      Semantic Fluency (Animals) 50 50      Trail Making Test: T-Score Percentile      Part A 52 58      Part B 43 25      Modified Wisconsin Card Sorting Test (MWCST): Standard/T-Score Percentile      Number of Categories Correct 37 9      Number of Perseverative Errors 38 12      Number of Total Errors 33 5      Percent Perseverative Errors 47 38  Executive Function Composite 79 8      Boston Diagnostic Aphasia Exam: Raw Score Scaled Score      Complex Ideational Material 7 2      Clock Drawing Raw Score Descriptor      Command 9 WNL      Rating Scales         Raw Score Descriptor  Beck Depression Inventory - II 41 Severe  Beck Anxiety Inventory 42 Severe   Austin Pongratz V. Nicole Kindred PsyD, [place] Clinical Neuropsychologist

## 2020-11-29 NOTE — Progress Notes (Signed)
[facility]  Patient Name: Janet Cohen MRN: [id number] Date of Birth: [birth year] Age: 55 y.o. 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 Her history has varied somewhat across appointments. Within the present evaluation, she told me that her memory problems started after a head injury in 11/14/1998 and worsened after the death of her husband in 2018-11-14. Record review shows that the head injury was in fact in 14-Nov-2014, was considered mild, and she was triaged at the [facility] ED but was not admitted. She notices problems with attention/concentration, keeping up with the demands of her work, remembering and learning information, and also with verbal comprehension. She has very significant psychiatric symptoms at present and reported feeling "angry, irritable, mad" and acknowledged feeling hopeless and worthless. She has nonsuicidal morbid ideation but vehemently denied any actual thoughts of harming herself, any intent, or any plan. On interview, she had some odd behaviors and appeared quite anxious. She also stated that she does not like to go to stores or even fill up her car with gas because being around other people makes her so nervous, suggesting possible paranoia. She has a very significant trauma history.   Neuropsychological testing shows evidence of low average single-word reading and unusually low performance on a verbal measure of premorbid abilities, which may reflect a learning disorder vs. limited education quantity/quality during formative years. These findings temper expectations for her test performance significantly. She had low scores on measures of memory with respect to both encoding and delayed recall, with marginally worse verbal as compared to visual performance. She  also demonstrated difficulties on select measures of executive function, particularly those involving reasoning with verbal information. She is reporting severe levels of depressive and anxiety symptoms and presented as quite distraught at times during the clinical interview.   Janet Cohen is thus demonstrating cognitive and behavioral changes. While I suspect that the test findings heavily reflect premorbid factors (whether developmental or learning-related), she likely has some difficulties beyond that. Given her current levels of affective distress and the fact that these difficulties started after the death of her husband, a time of significant emotional adversity, I think a psychiatric etiology is most likely. Differential includes trauma-and stressor related disorders (including prolonged grief disorder and conditions in the PTSD spectrum) and depressive disorders (including depression with psychosis). A primary psychotic disorder is viewed as less likely but perhaps within the differential. There are no indications of a manic/hypomanic spectrum disorder. She has risk factors and may have a trauma-related component to her seizure disorder. Medication side effects may also be contributory. Lateralization of findings is deferred given the presence of multiple confounding factors. I think her head injury probably sensitized her to cognitive problems more than it did any lasting neuropsychologically relevant damange although the possibility of some contribution cannot be entirely rule out. MRI with SWI sequences will be helpful for further investigation. Recommend referral and consultation with psychiatry in addition to intensive psychotherapy.   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 Although you later distinguished yourself in college and did well in graduate school, the type of instruction provided at those levels is not equivalent to that in grade school with respect to basic academic abilities. Apart from that, you had difficulty on memory measures and there were a few scattered low scores in other areas. 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 This is good news, because it means that your difficulties may improve with resolution of your affective distress.   First, my primary recommendation is that you seek professional, comprehensive, mental health treatment. You reported severe levels of symptoms including non-suicidal morbid ideation. 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 You expressed a dislike of psychiatry, but I think in your case, a skilled psychiatrist will be needed to both better ferret out the nature of your psychiatric disorder and for treatment.   Second, I would also recommend that you engage in counseling. I think it is excellent that you are doing grief counseling although I think that it might also be beneficial to consider mental health counseling, because I think it is likely that the death of your husband brought up other issues for you. Given the level of your distress, you might even consider something like our partial hospitalization program, which can be reached at [phone].   Third,  continue to follow with Dr. 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 I would also recommend that you obtain the MRI of the brain she has ordered for further investigation as to any possible sequelae from your head injury. MRI is more sensitive for head injury than CT, and I only have a CT on file for you.   As for your head injury, I cannot rule out that it is having some effect, but the types of difficulties you are reporting are not well explained by a head injury alone. I was able to look at your records from [facility] and the injury in fact occurred in 2016 and said that you lost consciousness for less than 30 minutes and did not have any sequelae, which suggests it was a mild injury. Even a mild injury can cause significant problems but the good news is they typically remit with the passage of time.   When an individual strikes their head and experiences an alteration of consciousness or a brief loss of consciousness without complications like a brain bleed, this is called a concussion or "mild traumatic brain injury." This is not a brain injury per se in terms of causing structural damage to the brain, but it does negatively impact the brain's functioning in terms of energy metabolism (i.e, it is a metabolic injury). Most people experience cognitive symptoms following a concussion, and these typically resolve within hours to days. The vast majority of individuals recover completely in a matter of time, without any measurable lasting cognitive impairments. A small minority of people may experience persisting symptoms, the so-called "postconcussion syndrome," and there is debate about why this is the case. Oftentimes, the symptoms that people report are things that happen with  a relatively high frequency in the general population (e.g., things like irritability, difficulties concentrating, and headaches). These things are also associated with depression and anxiety,  which are two of the biggest risk factors risk factors for postconcussion syndrome. Things like changes in routines, pain related to headaches, difficulties readjusting to work and normal activities, financial stress, depression, and anxiety about full recovery may be more important precipitants of postconcussive symptoms than the brain injury itself. My best recommendation is to resume normal levels of activity as tolerated after you are medically cleared to do so and not to overly focus on cognitive problems, which will only make them worse by distracting you and detracting attention from the task at hand.    You should also know that many of the medications you are on including Alprazolam, depakote, and oxycodone can cause drowsiness and cognitive side effects. I tell you this not as an instruction to change any of your medications in fact, it is imperative that you do not change any of your medications without doing so with the help of a prescribing provider, but because you should know that they may be contributing to your thinking and memory problems.   For problems with attention and concentration, I think the most important thing is addressing the above noted issues, but the following behavioral recommendations may also be helpful:   For problems with attention and concentration, consider the following recommendations: . Build routines into your day and stick to them, doing the same thing at the same time helps your body get into a rhythm that makes it harder to forget something you need to do.  . Use sticky notes, reminders, a calendar, or your smart phone to provide yourself with reminders, to do lists, and help track appointments.  . When you need to do work for school or other things, create an environment that is conducive to that work. This may include putting electronic devices that can be distracting outside the room and working in an area that is quiet and free from distractions.  . Break  things down into smaller steps to help get started and stop yourself from feeling overwhelmed.  . Plan breaks throughout the day where you can get up and move even if it's for just a few minutes at a time.  . Focus your attention on only one thing and avoid multitasking. Although some people are better at it than others, nobody's task performance is as good as it could be when they are alternating attention between two different tasks.  . Stay mindful throughout your day and monitor whether you are feeling overwhelmed or disorganized to identify problems before they happen.  . Avoid working under time pressure when you may be more liable to make mistakes.   I would like to provide you with the following resources, which you should not hesitate to use if you have any thoughts of harming yourself or others:   Find Help 24/7, [place] Call our 24-hour HelpLine at 304-532-5984 or [place]: 1-800-SUICIDE    The National Suicide Prevention Lifeline: 1-800-273-TALK  Test Findings  Test scores are summarized in additional documentation associated with this encounter. Test scores are relative to age, gender, and educational history as available and appropriate. There were no concerns about performance validity as all findings fell within normal expectations.   General Intellectual Functioning/Achievement:  Performance on single word reading was low average. Performance on the RIST index was low average, although there  was some clinically relevant variability amongst its component subtests. Performance on the verbally mediated portion was unusually low, which suggests either a verbal learning problem or perhaps limited academic involvement/quality during primary school years. Her performance was average on the more visually oriented subtest. These findings temper expectations for test performance significantly, particularly on verbally mediated measures.    Attention and Processing Efficiency: Performance on indicators of working memory fell at a reasonable low average level on the Working Memory Index of the WAIS-IV. Indicators of digit repetition were average for digit repetition forward, backward, and digit resequencing in ascending order. Mental solving of arithmetical word problems was low average.   Indicators of processing efficiency fell at an average level on the Processing Speed Index of the WAIS-IV. Performance was average on two different measures of timed number-symbol coding. Performance was low average on a measure of efficient visual matching and efficient visual scanning.   Language: Language findings showed intact visual object confrontation naming. Generation of words was low average in response to the letters F-A-S and average in response to the category prompt "animals."   Visuospatial Function: Performance on visuospatial and constructional indicators was low average, with a low score on judgment of angular line orientations of uncertain significance in the context of her overall profile. Figure copy was average.   Learning and Memory: Measures of learning and memory generated low scores, although given relatively similar performance on encoding and delayed free recall, I think these findings more likely reflect difficulties with getting information in as opposed to a storage problem.   In the verbal realm, she learned 3, 4, 6, and 4 words of a 10-item word list across four learning trials, which is a flat learning curve and an extremely low score. She did not recall any words following a standard delay and also performed at an extremely low level on recognition discriminability testing. Memory for a short story was extremely low on immediate and delayed recall.   In the visual realm, delayed recall of a modestly complex figure was unusually low.   Executive Functions: Executive measures were mixed with an impression of some  possible issues considering the overall profile. She performed at the margin of the low average and unusually low ranges on the [employer], with low average categories completed and perseverative errors scores. Reasoning with complex verbal information was extremely low. By contrast, alternating sequencing numbers and letters of the alphabet was low average. Generation of words in response to the letters F-A-S was also low average.   Rating Scale(s): Janet Cohen reported severe levels of affective symptoms on self-rating measures of depressive and anxiety symptoms. She endorsed an item suggesting that she has thoughts of killing herself but would not carry them out. When I queried that with her, she stated that she in fact has nonsuicidal morbid ideation, such as praying that "god takes" her. She is not thinking of actually killing herself. She stated this is a chronic, ongoing issue since at least when her husband passed. She identified her children as protective factors and expressed an ongoing commitment to safety.   Viviano Simas Nicole Kindred, PsyD, ABN Clinical Neuropsychologist  Coding and Compliance  Billing below reflects technician time, my direct face-to-face time with the patient, time spent in test administration, and time spent in professional activities including but not limited to: neuropsychological test interpretation, integration of neuropsychological test data with clinical history, report preparation, treatment planning, care coordination, and review of diagnostically pertinent medical history or  studies.   Services associated with this encounter: Clinical Interview [phone]) plus 225 minutes (96132/96133; Neuropsychological Evaluation by Professional)  125 minutes (96138/96139; Neuropsychological Testing by Technician)

## 2020-12-05 ENCOUNTER — Ambulatory Visit (INDEPENDENT_AMBULATORY_CARE_PROVIDER_SITE_OTHER): Payer: BC Managed Care – PPO | Admitting: Counselor

## 2020-12-05 ENCOUNTER — Encounter: Payer: Self-pay | Admitting: Counselor

## 2020-12-05 ENCOUNTER — Other Ambulatory Visit: Payer: Self-pay

## 2020-12-05 DIAGNOSIS — F439 Reaction to severe stress, unspecified: Secondary | ICD-10-CM | POA: Diagnosis not present

## 2020-12-05 NOTE — Progress Notes (Signed)
NEUROPSYCHOLOGY FEEDBACK NOTE [facility]  Feedback Note: I met with Janet Cohen to review the findings resulting from her neuropsychological evaluation. Since the last appointment, she has been about the same. She continues to have difficulty when public speaking, she had to give a presentation today and dropped her note cards and then stopped crying. She said that despite these behaviors, she was offered a promotion. She told them that she didn't feel ready and turned it down. Time was spent reviewing the impressions and recommendations that are detailed in the evaluation report. I was candid with her that I think this is primarily a psychiatric issue and that she will require skilled psychological and perhaps also psychiatric care. I was also candid that there is probably a significant trauma-related component to her issues. She does not want to talk about that, she prefers to keep those things in the past, which makes it all the more likely that they are a central issue. I took time to explain the findings and answer all the patient's questions. I encouraged Janet Cohen to contact me should she have any further questions or if further follow up is desired.   Current Medications and Medical History   Current Outpatient Medications  Medication Sig Dispense Refill  . alprazolam (XANAX) 2 MG tablet Take 2 mg by mouth daily as needed for anxiety.     Marland Kitchen amphetamine-dextroamphetamine (ADDERALL) 20 MG tablet Take 20 mg by mouth daily.    . cetirizine (ZYRTEC) 10 MG tablet Take 10 mg by mouth daily.    . cyanocobalamin (,VITAMIN B-12,) 1000 MCG/ML injection Inject 1,000 mcg into the muscle every 30 (thirty) days.     . divalproex (DEPAKOTE ER) 500 MG 24 hr tablet Take 2 tablets (1,000 mg total) by mouth at bedtime. 60 tablet 11  . doxepin (SINEQUAN) 25 MG capsule Take 1 capsule (25 mg total) by mouth at bedtime. 14 capsule 0  . escitalopram (LEXAPRO) 20 MG tablet Take 20 mg by mouth  daily.    Marland Kitchen lisinopril (PRINIVIL,ZESTRIL) 20 MG tablet Take 20 mg by mouth daily. RAN OUT OF BP MEDS 3 DAYS AGO    . Multiple Vitamins-Minerals (MULTIVITAMIN ADULT PO) Take 1 tablet by mouth 3 (three) times daily.     Marland Kitchen oxyCODONE-acetaminophen (PERCOCET) 10-325 MG tablet as needed.    . rizatriptan (MAXALT-MLT) 10 MG disintegrating tablet Take 1 tablet (10 mg total) by mouth as needed. May repeat in 2 hours if needed 15 tablet 6   No current facility-administered medications for this visit.    Patient Active Problem List   Diagnosis Date Noted  . Abnormal uterine bleeding 09/22/2018  . Status post laparoscopic hysterectomy 09/22/2018  . Concussion with loss of consciousness 06/14/2015  . Chronic headache 06/14/2015  . Chest pain 04/14/2014  . Seizure disorder ([place]) 04/14/2014    Mental Status and Behavioral Observations  Janet Cohen presented on time to the present encounter and was alert and generally oriented. Speech was normal in rate, rhythm, volume, and prosody. Self-reported mood was "bland" and affect was neutral with moments of apparent dysphoria. Thought process was logical and goal oriented and thought content was appropriate to the topics discussed. The patient denied that she is having any thoughts of harming herself or anyone else on direct questioning.   Plan  Feedback provided regarding the patient's neuropsychological evaluation. She presented as somewhat reassured that this does not seem likely to be a dementia, sequelae of her head injury, or other  primary neurologic condition although she does have some ambivalence about engaging in trauma treatment. She is seeing a Social worker in [employer] and will continue doing so. I offered referrals for psychiatric but she was not interested. Janet Cohen was encouraged to contact me if any questions arise or if further follow up is desired.   Viviano Simas Nicole Kindred, PsyD, ABN Clinical Neuropsychologist  Service(s) Provided  at This Encounter: 16 minutes [phone]; Psychotherapy with patient/family)

## 2020-12-05 NOTE — Patient Instructions (Signed)
Your performance and presentation were consistent with some findings that may relfect preexisting factors, either a learning difficulty or the fact that you were not able to consistently attend and engage in school due to all the things going on at home. Although you later distinguished yourself in college and did well in graduate school, the type of instruction provided at those levels is not equivalent to that in grade school with respect to basic academic abilities. Apart from that, you had difficulty on memory measures and there were a few scattered low scores in other areas. 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 This is good news, because it means that your difficulties may improve with resolution of your affective distress.   First, my primary recommendation is that you seek professional, comprehensive, mental health treatment. You reported severe levels of symptoms including non-suicidal morbid ideation. 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 You expressed a dislike of psychiatry, but I think in your case, a skilled psychiatrist will be needed to both better ferret out the nature of your psychiatric disorder and for treatment.   Second, I would also recommend that you engage in counseling. I think it is excellent that you are doing grief counseling although I think that it might also be beneficial to consider mental health counseling, because I think it is likely that the death of your husband brought up other issues for you. We discussed the fact that a therapist who focuses significantly in trauma would likely be a good fit, but you wanted to stick with your counselor.    I also offered you a referral for psychiatry, but you were not interested in that.   Third, continue to follow with Dr. 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 I would also recommend that you obtain the MRI of the brain she has ordered for further investigation as to any possible sequelae from your head injury. MRI is more sensitive for head injury than CT, and I only have a CT on file for you.   As for your head injury, I cannot rule out that it is having some effect, but the types of difficulties you are reporting are not well explained by a head injury alone. I was able to look at your records from [facility] and the injury in fact occurred in 2016 and said that you lost consciousness for less than 30 minutes and did not have any sequelae, which suggests it was a mild injury. Even a mild injury can cause significant problems but the good news is they typically remit with the passage of time.   When an individual strikes their head and experiences an alteration of consciousness or a brief loss of consciousness without complications like a brain bleed, this is called a concussion or "mild traumatic brain injury." This is not a brain injury per se in terms of causing structural damage to the brain, but it does negatively impact the brain's functioning in terms of energy metabolism (i.e, it is a metabolic injury). Most people experience cognitive symptoms following a concussion, and these typically resolve within hours to days. The vast majority of individuals  recover completely in a matter of time, without any measurable lasting cognitive impairments. A small minority of people may experience persisting symptoms, the so-called "postconcussion syndrome," and there is debate about why this is the case. Oftentimes, the symptoms that people report are things that happen with a relatively high frequency in the general population (e.g., things like irritability,  difficulties concentrating, and headaches). These things are also associated with depression and anxiety, which are two of the biggest risk factors risk factors for postconcussion syndrome. Things like changes in routines, pain related to headaches, difficulties readjusting to work and normal activities, financial stress, depression, and anxiety about full recovery may be more important precipitants of postconcussive symptoms than the brain injury itself. My best recommendation is to resume normal levels of activity as tolerated after you are medically cleared to do so and not to overly focus on cognitive problems, which will only make them worse by distracting you and detracting attention from the task at hand.    You should also know that many of the medications you are on including Alprazolam, depakote, and oxycodone can cause drowsiness and cognitive side effects. I tell you this not as an instruction to change any of your medications in fact, it is imperative that you do not change any of your medications without doing so with the help of a prescribing provider, but because you should know that they may be contributing to your thinking and memory problems.   For problems with attention and concentration, I think the most important thing is addressing the above noted issues, but the following behavioral recommendations may also be helpful:   For problems with attention and concentration, consider the following recommendations:  Build routines into your day and stick to them, doing the same thing at the same time helps your body get into a rhythm that makes it harder to forget something you need to do.   Use sticky notes, reminders, a calendar, or your smart phone to provide yourself with reminders, to do lists, and help track appointments.   When you need to do work for school or other things, create an environment that is conducive to that work. This may include putting electronic devices that  can be distracting outside the room and working in an area that is quiet and free from distractions.   Break things down into smaller steps to help get started and stop yourself from feeling overwhelmed.   Plan breaks throughout the day where you can get up and move even if it's for just a few minutes at a time.   Focus your attention on only one thing and avoid multitasking. Although some people are better at it than others, nobody's task performance is as good as it could be when they are alternating attention between two different tasks.   Stay mindful throughout your day and monitor whether you are feeling overwhelmed or disorganized to identify problems before they happen.   Avoid working under time pressure when you may be more liable to make mistakes.   I would like to provide you with the following resources, which you should not hesitate to use if you have any thoughts of harming yourself or others:  Find Help 24/7, [place] Call our 24-hour HelpLine at [place]: 1-800-SUICIDE  The National Suicide Prevention Lifeline: [employer]

## 2020-12-26 ENCOUNTER — Ambulatory Visit: Payer: 59 | Admitting: Neurology

## 2021-06-06 ENCOUNTER — Encounter (HOSPITAL_COMMUNITY): Payer: Self-pay

## 2021-06-06 NOTE — Telephone Encounter (Signed)
Error

## 2021-08-09 ENCOUNTER — Other Ambulatory Visit: Payer: Self-pay

## 2021-08-09 ENCOUNTER — Emergency Department (HOSPITAL_COMMUNITY)
Admission: EM | Admit: 2021-08-09 | Discharge: 2021-08-09 | Disposition: A | Discharge status: Home / Self Care | Payer: BC Managed Care – PPO | Attending: Emergency Medicine | Admitting: Emergency Medicine

## 2021-08-09 ENCOUNTER — Encounter (HOSPITAL_COMMUNITY): Payer: Self-pay

## 2021-08-09 DIAGNOSIS — R0602 Shortness of breath: Secondary | ICD-10-CM | POA: Insufficient documentation

## 2021-08-09 DIAGNOSIS — R079 Chest pain, unspecified: Secondary | ICD-10-CM | POA: Insufficient documentation

## 2021-08-09 DIAGNOSIS — R531 Weakness: Secondary | ICD-10-CM | POA: Insufficient documentation

## 2021-08-09 DIAGNOSIS — Z5321 Procedure and treatment not carried out due to patient leaving prior to being seen by health care provider: Secondary | ICD-10-CM | POA: Insufficient documentation

## 2021-08-09 NOTE — ED Provider Triage Note (Signed)
Emergency Medicine Provider Triage Evaluation Note  Devanny DELIA SLATTEN , a 56 y.o. female  was evaluated in triage.  Pt complains of weakness.  Patient states that this morning she was in the tub and felt she was having difficulty getting out of the tub.  States that it took her "a while" to get out of the tub and onto the floor.  She states that she stayed there for a while until she felt she could get up and get dressed.  She states that then she was leaving the house and "blacked out" on the porch and fell.  She states that she was having chest pain at the time.  Also reports intermittent shortness of breath.  She states that the symptoms have been happening since her husband died in February 17, 2021.  Review of Systems  Positive: See above Negative:   Physical Exam  BP 138/84 (BP Location: Left Arm)    Pulse 80    Temp 98.2 F (36.8 C) (Oral)    Resp 14    LMP 09/17/2018    SpO2 100%  Gen:   Awake, no distress   Resp:  Normal effort, lungs clear to auscultation MSK:   Moves extremities without difficulty  Other:  S1/S2 without murmur.  Radial pulses 2+.  Medical Decision Making  Medically screening exam initiated at 2:02 PM.  Appropriate orders placed.  Caddie ROSARIO DUEY was informed that the remainder of the evaluation will be completed by another provider, this initial triage assessment does not replace that evaluation, and the importance of remaining in the ED until their evaluation is complete.     Mickie Hillier, PA-C 08/09/21 1404

## 2021-08-09 NOTE — ED Triage Notes (Addendum)
Pt arrived POV from home c/o balance issues, dizziness, and CP. Pt's LKW was at least 4 months ago. Pt states the CP has been going on since 2020.  Pt states EMS came out 4 months ago but she refused to come.

## 2022-04-02 ENCOUNTER — Other Ambulatory Visit: Payer: Self-pay | Admitting: Family Medicine

## 2022-04-02 ENCOUNTER — Ambulatory Visit
Admission: RE | Admit: 2022-04-02 | Discharge: 2022-04-02 | Disposition: A | Discharge status: Home / Self Care | Payer: Self-pay | Source: Ambulatory Visit | Attending: Family Medicine | Admitting: Family Medicine

## 2022-04-02 DIAGNOSIS — T1490XA Injury, unspecified, initial encounter: Secondary | ICD-10-CM

## 2022-05-09 ENCOUNTER — Other Ambulatory Visit: Payer: Self-pay | Admitting: Family Medicine

## 2022-05-09 DIAGNOSIS — Z1231 Encounter for screening mammogram for malignant neoplasm of breast: Secondary | ICD-10-CM

## 2022-10-23 ENCOUNTER — Emergency Department (HOSPITAL_COMMUNITY): Payer: Self-pay

## 2022-10-23 ENCOUNTER — Emergency Department (HOSPITAL_COMMUNITY)
Admission: EM | Admit: 2022-10-23 | Discharge: 2022-10-23 | Disposition: A | Discharge status: Home / Self Care | Payer: Self-pay | Attending: Emergency Medicine | Admitting: Emergency Medicine

## 2022-10-23 DIAGNOSIS — W108XXA Fall (on) (from) other stairs and steps, initial encounter: Secondary | ICD-10-CM | POA: Insufficient documentation

## 2022-10-23 DIAGNOSIS — S8992XA Unspecified injury of left lower leg, initial encounter: Secondary | ICD-10-CM | POA: Insufficient documentation

## 2022-10-23 DIAGNOSIS — Z79899 Other long term (current) drug therapy: Secondary | ICD-10-CM | POA: Insufficient documentation

## 2022-10-23 NOTE — ED Provider Triage Note (Signed)
Emergency Medicine Provider Triage Evaluation Note  Janet Cohen , a 57 y.o. female  was evaluated in triage.  Pt complains of left knee pain after falling down some stairs yesterday.  She has been walking on it all day and reports increased pain.  Denies other injury related to the fall.    Review of Systems  Positive: As above Negative: As above  Physical Exam  LMP 09/17/2018  Gen:   Awake, no distress   Resp:  Normal effort  MSK:   Moves extremities without difficulty  Other:  No obvious deformity to left lower extremity   Medical Decision Making  Medically screening exam initiated at 4:33 PM.  Appropriate orders placed.  Ka' M Colgan was informed that the remainder of the evaluation will be completed by another provider, this initial triage assessment does not replace that evaluation, and the importance of remaining in the ED until their evaluation is complete.     Theressa Stamps R, [place] 10/23/22 [phone]

## 2022-10-23 NOTE — Progress Notes (Signed)
Orthopedic Tech Progress Note Patient Details:  Janet Cohen 15-Apr-1966 [id number]  Ortho Devices Type of Ortho Device: Knee Immobilizer Ortho Device/Splint Location: LLE Ortho Device/Splint Interventions: Ordered, Application, Adjustment   Post Interventions Patient Tolerated: Well, Ambulated well, Difficulty with ambulation Instructions Provided: Poper ambulation with device, Care of device  [name] 10/23/2022, 9:13 PM

## 2022-10-23 NOTE — ED Triage Notes (Signed)
Patient here for evaluation of left knee pain after falling down some stairs yesterday. Patient is alert, oriented, and in no apparent distress at this time.

## 2022-10-23 NOTE — Discharge Instructions (Addendum)
You were seen for your knee injury in the emergency department.   At home, please take tylenol and ibuprofen for your pain.  Use ice for the swelling.  Please also use your crutches and knee immobilizer while at home.   Check your MyChart online for the results of any tests that had not resulted by the time you left the emergency department.   Follow-up with your orthopedic doctor in several days.    Return immediately to the emergency department if you experience any of the following: Weakness or numbness of your foot, or any other concerning symptoms.    Thank you for visiting our Emergency Department. It was a pleasure taking care of you today.

## 2022-10-23 NOTE — ED Provider Notes (Signed)
Janet Cohen   CSN: [id number] Arrival date & time: 10/23/22  1627     History  Chief Complaint  Patient presents with   Janet Cohen' Janet Cohen is a 57 y.o. female.  57 year old female who presents to the emergency department with left knee pain.  She had a mechanical fall down several stairs while carrying heavy objects.  Struck her knee on the way down.  No head strike or LOC.  No other injuries.  Has been having worsening pain and swelling of her left knee.  Also having difficulty bearing weight.       Home Medications Prior to Admission medications   Medication Sig Start Date End Date Taking? Authorizing Provider  alprazolam Duanne Moron) 2 MG tablet Take 2 mg by mouth daily as needed for anxiety.  08/24/18   [provider]  amphetamine-dextroamphetamine (ADDERALL) 20 MG tablet Take 20 mg by mouth daily. 09/07/20   [provider]  cetirizine (ZYRTEC) 10 MG tablet Take 10 mg by mouth daily. 07/15/20   [provider]  cyanocobalamin (,VITAMIN B-12,) 1000 MCG/ML injection Inject 1,000 mcg into the muscle every 30 (thirty) days.  07/02/18   [provider]  divalproex (DEPAKOTE ER) 500 MG 24 hr tablet Take 2 tablets (1,000 mg total) by mouth at bedtime. 06/14/15   Marcial Pacas, MD  doxepin (SINEQUAN) 25 MG capsule Take 1 capsule (25 mg total) by mouth at bedtime. 06/21/14   Ripley Fraise, MD  escitalopram (LEXAPRO) 20 MG tablet Take 20 mg by mouth daily. 09/07/20   [provider]  lisinopril (PRINIVIL,ZESTRIL) 20 MG tablet Take 20 mg by mouth daily. RAN OUT OF BP MEDS 3 DAYS AGO 06/04/18   [provider]  Multiple Vitamins-Minerals (MULTIVITAMIN ADULT PO) Take 1 tablet by mouth 3 (three) times daily.     [provider]  oxyCODONE-acetaminophen (PERCOCET) 10-325 MG tablet as needed. 10/24/18   [provider]  rizatriptan (MAXALT-MLT) 10 MG disintegrating  tablet Take 1 tablet (10 mg total) by mouth as needed. May repeat in 2 hours if needed 06/14/15   Marcial Pacas, MD      Allergies    Ibuprofen, Tramadol, Bee venom, Dilaudid [hydromorphone hcl], Gabapentin, Naproxen, Penicillins, Tylenol [acetaminophen], Vicodin [hydrocodone-acetaminophen], and Toradol [ketorolac tromethamine]    Review of Systems   Review of Systems  Physical Exam Updated Vital Signs BP 105/70 (BP Location: Right Arm)   Pulse 83   Temp 98.9 F (37.2 C) (Oral)   Resp 18   LMP 09/17/2018   SpO2 100%  Physical Exam Constitutional:      General: She is not in acute distress.    Appearance: Normal appearance. She is not ill-appearing.  HENT:     Head: Normocephalic and atraumatic.     Right Ear: External ear normal.     Left Ear: External ear normal.     Mouth/Throat:     Mouth: Mucous membranes are moist.     Pharynx: Oropharynx is clear.  Eyes:     Extraocular Movements: Extraocular movements intact.     Conjunctiva/sclera: Conjunctivae normal.     Pupils: Pupils are equal, round, and reactive to light.  Neck:     Comments: No C-spine midline tenderness to palpation Cardiovascular:     Rate and Rhythm: Normal rate and regular rhythm.     Pulses: Normal pulses.     Heart sounds: Normal heart sounds.  Pulmonary:  Effort: Pulmonary effort is normal. No respiratory distress.     Breath sounds: Normal breath sounds.  Abdominal:     General: Abdomen is flat. Bowel sounds are normal.     Palpations: Abdomen is soft.     Tenderness: There is no abdominal tenderness. There is no guarding.  Musculoskeletal:     Cervical back: No rigidity or tenderness.     Comments: No tenderness to palpation of midline thoracic or lumbar spine.  No step-offs palpated.  No tenderness to palpation of chest wall.  No bruising noted.  No tenderness to palpation of bilateral clavicles.  No tenderness to palpation, bruising, or deformities noted of bilateral shoulders, elbows,  wrists, hips, or ankles.  Significant tenderness to palpation of the patella.  Does appear to be slightly high riding as well.  Patient having difficulty with left knee extension and in the seated position with the knee flexed is only able to extend to approximately 20 degrees.  Significant effusion noted on that side.  Neurological:     General: No focal deficit present.     Mental Status: She is alert and oriented to person, place, and time. Mental status is at baseline.     Cranial Nerves: No cranial nerve deficit.     Sensory: No sensory deficit.     ED Results / Procedures / Treatments   Labs (all labs ordered are listed, but only abnormal results are displayed) Labs Reviewed - No data to display  EKG None  Radiology DG Knee Complete 4 Views Left  Result Date: 10/23/2022 CLINICAL DATA:  Status post fall. EXAM: LEFT KNEE - COMPLETE 4+ VIEW COMPARISON:  None Available. FINDINGS: No evidence of an acute fracture or dislocation. No evidence of arthropathy or other focal bone abnormality. There is mild medial soft tissue swelling. A moderate sized joint effusion is also noted. IMPRESSION: Mild medial soft tissue swelling and moderate sized joint effusion. Electronically Signed   By: Virgina Norfolk M.D.   On: 10/23/2022 17:26    Procedures Procedures   Medications Ordered in ED Medications - No data to display  ED Course/ Medical Decision Making/ A&P                             Medical Decision Making  Darika' M Cohen is a 57 y.o. female who presents emergency department with knee pain and swelling after mechanical fall  Initial Ddx:  Fracture, leg assessment this injury, meniscal tear  MDM:  Will obtain x-rays to evaluate for fracture.  With patient's difficulty extending her knee concerned that she may potentially have a patellar tendon injury especially with her high riding patella on exam.  Plan:  X-ray Offered pain medication but patient declined  ED  Summary/Re-evaluation:  X-rays not show any evidence of fracture.  Offered CT to the patient to evaluate for occult fracture since she is still having difficulty bearing weight on her leg but she requested to go.  Was given knee immobilizer, crutches, and instructions to follow-up with orthopedics in clinic.  This patient presents to the ED for concern of complaints listed in HPI, this involves an extensive number of treatment options, and is a complaint that carries with it a high risk of complications and morbidity. Disposition including potential need for admission considered.   Dispo: DC Home. Return precautions discussed including, but not limited to, those listed in the AVS. Allowed pt time to ask questions which were answered  fully prior to dc.  Additional history obtained from  friend Records reviewed Outpatient Clinic Notes I independently reviewed the following imaging with scope of interpretation limited to determining acute life threatening conditions related to emergency care: Extremity x-ray(s) and agree with the radiologist interpretation with the following exceptions: None I have reviewed the patients home medications and made adjustments as needed  Final Clinical Impression(s) / ED Diagnoses Final diagnoses:  Injury of left knee, initial encounter    Rx / DC Orders ED Discharge Orders     None         Fransico Meadow, MD 10/25/22 1041

## 2022-10-23 NOTE — ED Notes (Signed)
Ortho tech at bedside 

## 2023-03-12 ENCOUNTER — Other Ambulatory Visit: Payer: Self-pay | Admitting: Family Medicine

## 2023-03-12 ENCOUNTER — Ambulatory Visit
Admission: RE | Admit: 2023-03-12 | Discharge: 2023-03-12 | Disposition: A | Discharge status: Home / Self Care | Payer: 59 | Source: Ambulatory Visit | Attending: Family Medicine | Admitting: Family Medicine

## 2023-03-12 DIAGNOSIS — Z8611 Personal history of tuberculosis: Secondary | ICD-10-CM

## 2023-03-29 ENCOUNTER — Emergency Department (HOSPITAL_COMMUNITY): Payer: 59

## 2023-03-29 ENCOUNTER — Encounter (HOSPITAL_COMMUNITY): Payer: Self-pay | Admitting: Emergency Medicine

## 2023-03-29 ENCOUNTER — Other Ambulatory Visit: Payer: Self-pay

## 2023-03-29 ENCOUNTER — Emergency Department (HOSPITAL_COMMUNITY)
Admission: EM | Admit: 2023-03-29 | Discharge: 2023-03-29 | Discharge status: Against Medical Advice | Payer: 59 | Attending: Emergency Medicine | Admitting: Emergency Medicine

## 2023-03-29 DIAGNOSIS — Z5329 Procedure and treatment not carried out because of patient's decision for other reasons: Secondary | ICD-10-CM | POA: Diagnosis not present

## 2023-03-29 DIAGNOSIS — I10 Essential (primary) hypertension: Secondary | ICD-10-CM | POA: Insufficient documentation

## 2023-03-29 DIAGNOSIS — E876 Hypokalemia: Secondary | ICD-10-CM | POA: Diagnosis not present

## 2023-03-29 DIAGNOSIS — R4182 Altered mental status, unspecified: Secondary | ICD-10-CM

## 2023-03-29 DIAGNOSIS — R531 Weakness: Secondary | ICD-10-CM | POA: Diagnosis present

## 2023-03-29 DIAGNOSIS — R4701 Aphasia: Secondary | ICD-10-CM | POA: Diagnosis not present

## 2023-03-29 DIAGNOSIS — R29898 Other symptoms and signs involving the musculoskeletal system: Secondary | ICD-10-CM

## 2023-03-29 LAB — COMPREHENSIVE METABOLIC PANEL
ALT: 17 U/L (ref 0–44)
AST: 25 U/L (ref 15–41)
Albumin: 3.5 g/dL (ref 3.5–5.0)
Alkaline Phosphatase: 126 U/L (ref 38–126)
Anion gap: 10 (ref 5–15)
BUN: 5 mg/dL — ABNORMAL LOW (ref 6–20)
CO2: 22 mmol/L (ref 22–32)
Calcium: 8.9 mg/dL (ref 8.9–10.3)
Chloride: 105 mmol/L (ref 98–111)
Creatinine, Ser: 0.75 mg/dL (ref 0.44–1.00)
GFR, Estimated: >60 mL/min (ref >60)
Glucose, Bld: 81 mg/dL (ref 70–99)
Potassium: 3.2 mmol/L — ABNORMAL LOW (ref 3.5–5.1)
Sodium: 137 mmol/L (ref 135–145)
Total Bilirubin: 0.3 mg/dL (ref 0.3–1.2)
Total Protein: 6.9 g/dL (ref 6.5–8.1)

## 2023-03-29 LAB — PROTIME-INR
INR: 1 (ref 0.8–1.2)
Prothrombin Time: 13.2 seconds (ref 11.4–15.2)

## 2023-03-29 LAB — DIFFERENTIAL
Abs Immature Granulocytes: 0.02 10*3/uL (ref 0.00–0.07)
Basophils Absolute: 0 10*3/uL (ref 0.0–0.1)
Basophils Relative: 1 %
Eosinophils Absolute: 0.1 10*3/uL (ref 0.0–0.5)
Eosinophils Relative: 2 %
Immature Granulocytes: 0 %
Lymphocytes Relative: 49 %
Lymphs Abs: 3.7 10*3/uL (ref 0.7–4.0)
Monocytes Absolute: 0.4 10*3/uL (ref 0.1–1.0)
Monocytes Relative: 6 %
Neutro Abs: 3.2 10*3/uL (ref 1.7–7.7)
Neutrophils Relative %: 42 %

## 2023-03-29 LAB — CBC
HCT: 38.1 % (ref 36.0–46.0)
Hemoglobin: 11.9 g/dL — ABNORMAL LOW (ref 12.0–15.0)
MCH: 26.7 pg (ref 26.0–34.0)
MCHC: 31.2 g/dL (ref 30.0–36.0)
MCV: 85.4 fL (ref 80.0–100.0)
Platelets: 240 10*3/uL (ref 150–400)
RBC: 4.46 MIL/uL (ref 3.87–5.11)
RDW: 14.6 % (ref 11.5–15.5)
WBC: 7.5 10*3/uL (ref 4.0–10.5)
nRBC: 0 % (ref 0.0–0.2)

## 2023-03-29 LAB — URINALYSIS, ROUTINE W REFLEX MICROSCOPIC
Bacteria, UA: NONE SEEN
Bilirubin Urine: NEGATIVE
Glucose, UA: NEGATIVE mg/dL
Ketones, ur: NEGATIVE mg/dL
Leukocytes,Ua: NEGATIVE
Nitrite: NEGATIVE
Protein, ur: NEGATIVE mg/dL
Specific Gravity, Urine: 1.015 (ref 1.005–1.030)
pH: 5 (ref 5.0–8.0)

## 2023-03-29 LAB — I-STAT CHEM 8, ED
BUN: 4 mg/dL — ABNORMAL LOW (ref 6–20)
Calcium, Ion: 1.12 mmol/L — ABNORMAL LOW (ref 1.15–1.40)
Chloride: 105 mmol/L (ref 98–111)
Creatinine, Ser: 0.7 mg/dL (ref 0.44–1.00)
Glucose, Bld: 77 mg/dL (ref 70–99)
HCT: 38 % (ref 36.0–46.0)
Hemoglobin: 12.9 g/dL (ref 12.0–15.0)
Potassium: 3.4 mmol/L — ABNORMAL LOW (ref 3.5–5.1)
Sodium: 140 mmol/L (ref 135–145)
TCO2: 23 mmol/L (ref 22–32)

## 2023-03-29 LAB — I-STAT VENOUS BLOOD GAS, ED
Acid-base deficit: 2 mmol/L (ref 0.0–2.0)
Bicarbonate: 24.5 mmol/L (ref 20.0–28.0)
Calcium, Ion: 1.12 mmol/L — ABNORMAL LOW (ref 1.15–1.40)
HCT: 38 % (ref 36.0–46.0)
Hemoglobin: 12.9 g/dL (ref 12.0–15.0)
O2 Saturation: 56 %
Potassium: 3.4 mmol/L — ABNORMAL LOW (ref 3.5–5.1)
Sodium: 141 mmol/L (ref 135–145)
TCO2: 26 mmol/L (ref 22–32)
pCO2, Ven: 46.4 mmHg (ref 44–60)
pH, Ven: 7.33 (ref 7.25–7.43)
pO2, Ven: 32 mmHg (ref 32–45)

## 2023-03-29 LAB — RAPID URINE DRUG SCREEN, HOSP PERFORMED
Amphetamines: NOT DETECTED
Barbiturates: NOT DETECTED
Benzodiazepines: POSITIVE — AB
Cocaine: NOT DETECTED
Opiates: NOT DETECTED
Tetrahydrocannabinol: NOT DETECTED

## 2023-03-29 LAB — APTT: aPTT: 36 seconds (ref 24–36)

## 2023-03-29 LAB — ETHANOL: Alcohol, Ethyl (B): <10 mg/dL (ref <10)

## 2023-03-29 LAB — CBG MONITORING, ED: Glucose-Capillary: 71 mg/dL (ref 70–99)

## 2023-03-29 MED ORDER — IOHEXOL 350 MG/ML SOLN
75.0000 mL | Freq: Once | INTRAVENOUS | Status: AC | PRN
  Administered 2023-03-29: 75 mL via INTRAVENOUS

## 2023-03-29 NOTE — ED Notes (Signed)
Pt requesting to leave AMA. MD Lynelle Doctor made aware. MD request that pt show she is able to ambulate. Pt ambulatory in hall, states she is just sore. MD requesting that pt wait for communication with neurology. Pt in agreeance to stay at this time for that conversation to occur.

## 2023-03-29 NOTE — Code Documentation (Signed)
Stroke Response Nurse Documentation Code Documentation  Janet Cohen is a 57 y.o. female arriving to [facility] via [facility] for code stroke. Patient on ladder at house to change smoke detector batteries, started to feel dizzy and texted friend who then called EMS. Patient has similar episodes though "it has never lasted this long before." EMS arrived to find patient with left sided weakness, right gaze, mute.   Stroke team at the bedside on patient arrival. Labs drawn and patient cleared for CT by Dr. Lynelle Doctor. Patient to CT with team. Initial NIH 17. Improved while in CT to 5. CT/CTA completed. Patient is not a candidate for IV Thrombolytic/IR due to rapidly improving/stroke not suspected.  Care Plan: q25m until out of window (until 2225) NIH/vitals, MRI. Bedside handoff with ED RN Morrie Sheldon.    Scarlette Slice K  Rapid Response RN

## 2023-03-29 NOTE — ED Notes (Signed)
PT stated that she wants to leave AMA, doctor was notified.

## 2023-03-29 NOTE — ED Notes (Signed)
Pt ambulatory to ED waiting room at this time.

## 2023-03-29 NOTE — ED Provider Notes (Signed)
[place] EMERGENCY DEPARTMENT AT [facility] Provider Note   CSN: [id number] Arrival date & time: 03/29/23  1840  An emergency department physician performed an initial assessment on this suspected stroke patient at 1840.  History {Add pertinent medical, surgical, social history, OB history to HPI:1} Chief Complaint  Patient presents with   Code Stroke    Janet Cohen is a 57 y.o. female.  HPI     Home Medications Prior to Admission medications   Medication Sig Start Date End Date Taking? Authorizing Provider  alprazolam Prudy Feeler) 2 MG tablet Take 2 mg by mouth daily as needed for anxiety.  08/24/18   [provider]  amphetamine-dextroamphetamine (ADDERALL) 20 MG tablet Take 20 mg by mouth daily. 09/07/20   [provider]  cetirizine (ZYRTEC) 10 MG tablet Take 10 mg by mouth daily. 07/15/20   [provider]  cyanocobalamin (,VITAMIN B-12,) 1000 MCG/ML injection Inject 1,000 mcg into the muscle every 30 (thirty) days.  07/02/18   [provider]  divalproex (DEPAKOTE ER) 500 MG 24 hr tablet Take 2 tablets (1,000 mg total) by mouth at bedtime. 06/14/15   Levert Feinstein, MD  doxepin (SINEQUAN) 25 MG capsule Take 1 capsule (25 mg total) by mouth at bedtime. 06/21/14   Zadie Rhine, MD  escitalopram (LEXAPRO) 20 MG tablet Take 20 mg by mouth daily. 09/07/20   [provider]  lisinopril (PRINIVIL,ZESTRIL) 20 MG tablet Take 20 mg by mouth daily. RAN OUT OF BP MEDS 3 DAYS AGO 06/04/18   [provider]  Multiple Vitamins-Minerals (MULTIVITAMIN ADULT PO) Take 1 tablet by mouth 3 (three) times daily.     [provider]  oxyCODONE-acetaminophen (PERCOCET) 10-325 MG tablet as needed. 10/24/18   [provider]  rizatriptan (MAXALT-MLT) 10 MG disintegrating tablet Take 1 tablet (10 mg total) by mouth as needed. May repeat in 2 hours if needed 06/14/15   Levert Feinstein, MD      Allergies    Ibuprofen, Tramadol,  Bee venom, Dilaudid [hydromorphone hcl], Gabapentin, Naproxen, Penicillins, Tylenol [acetaminophen], Vicodin [hydrocodone-acetaminophen], and Toradol [ketorolac tromethamine]    Review of Systems   Review of Systems  Physical Exam Updated Vital Signs BP 126/75 (BP Location: Left Arm)   Pulse 84   Temp 97.7 F (36.5 C) (Oral)   Resp 19   Ht 5\' 7"  (1.702 m)   Wt 62.4 kg   LMP 09/17/2018   SpO2 99%   BMI 21.55 kg/m  Physical Exam  ED Results / Procedures / Treatments   Labs (all labs ordered are listed, but only abnormal results are displayed) Labs Reviewed  CBC - Abnormal; Notable for the following components:      Result Value   Hemoglobin 11.9 (*)    All other components within normal limits  COMPREHENSIVE METABOLIC PANEL - Abnormal; Notable for the following components:   Potassium 3.2 (*)    BUN 5 (*)    All other components within normal limits  URINALYSIS, ROUTINE W REFLEX MICROSCOPIC - Abnormal; Notable for the following components:   Color, Urine STRAW (*)    Hgb urine dipstick SMALL (*)    All other components within normal limits  I-STAT CHEM 8, ED - Abnormal; Notable for the following components:   Potassium 3.4 (*)    BUN 4 (*)    Calcium, Ion 1.12 (*)    All other components within normal limits  I-STAT VENOUS BLOOD GAS, ED - Abnormal; Notable for the following components:  Potassium 3.4 (*)    Calcium, Ion 1.12 (*)    All other components within normal limits  ETHANOL  PROTIME-INR  APTT  DIFFERENTIAL  RAPID URINE DRUG SCREEN, HOSP PERFORMED  CBG MONITORING, ED    EKG EKG Interpretation Date/Time:  Saturday March 29 2023 19:29:56 EDT Ventricular Rate:  76 PR Interval:  182 QRS Duration:  96 QT Interval:  402 QTC Calculation: 452 R Axis:   62  Text Interpretation: Sinus rhythm RSR' in V1 or V2, right VCD or RVH No significant change since last tracing Confirmed by Linwood Dibbles [phone]) on 03/29/2023 8:27:45 PM  Radiology CT CERVICAL SPINE WO  CONTRAST  Result Date: 03/29/2023 CLINICAL DATA:  Spine fracture, cervical, traumatic suspected neck fracture. EXAM: CT CERVICAL SPINE WITH CONTRAST TECHNIQUE: Multiplanar CT images of the cervical spine were reconstructed from contemporary CTA of the Neck. RADIATION DOSE REDUCTION: This exam was performed according to the departmental dose-optimization program which includes automated exposure control, adjustment of the mA and/or kV according to patient size and/or use of iterative reconstruction technique. CONTRAST:  No additional. COMPARISON:  Head and neck CTA 10/13/2020 FINDINGS: Alignment: Trace retrolisthesis of C2 on C3. Skull base and vertebrae: No acute fracture or suspicious osseous lesion. Soft tissues and spinal canal: No prevertebral fluid or swelling. No visible canal hematoma. Disc levels: Mild cervical spondylosis with up to mild spinal stenosis from C2-3 through C5-6 due to disc bulging and small central or paracentral disc protrusions. Upper chest: No mass or consolidation in the included lung apices. Other: CTA reported separately. IMPRESSION: No acute cervical spine fracture. Electronically Signed   By: Sebastian Ache M.D.   On: 03/29/2023 19:20   CT ANGIO HEAD NECK W WO CM (CODE STROKE)  Result Date: 03/29/2023 CLINICAL DATA:  Stroke suspected, fall from height EXAM: CT ANGIOGRAPHY HEAD AND NECK WITH AND WITHOUT CONTRAST TECHNIQUE: Multidetector CT imaging of the head and neck was performed using the standard protocol during bolus administration of intravenous contrast. Multiplanar CT image reconstructions and MIPs were obtained to evaluate the vascular anatomy. Carotid stenosis measurements (when applicable) are obtained utilizing NASCET criteria, using the distal internal carotid diameter as the denominator. RADIATION DOSE REDUCTION: This exam was performed according to the departmental dose-optimization program which includes automated exposure control, adjustment of the mA and/or kV  according to patient size and/or use of iterative reconstruction technique. COMPARISON:  None Available. FINDINGS: CT HEAD FINDINGS For noncontrast findings, please see same day CT head. CTA NECK FINDINGS Aortic arch: Standard branching. Imaged portion shows no evidence of aneurysm or dissection. No significant stenosis of the major arch vessel origins. Right carotid system: No evidence of dissection, occlusion, or hemodynamically significant stenosis (greater than 50%). Left carotid system: No evidence of dissection, occlusion, or hemodynamically significant stenosis (greater than 50%). Vertebral arteries: No evidence of dissection, occlusion, or hemodynamically significant stenosis (greater than 50%). Skeleton: No no acute fracture or traumatic listhesis. Other neck: In the left aspect of the oropharyngeal mucosa, there is a hypodense collection, with hyperdense material, possibly active extravasation (series 5, image 184 and series 8, image 109). Otherwise negative. Upper chest: No focal pulmonary opacity or pleural effusion. Review of the MIP images confirms the above findings CTA HEAD FINDINGS Anterior circulation: Both internal carotid arteries are patent to the termini, without significant stenosis. A1 segments patent, hypoplastic on the right. Normal anterior communicating artery. Anterior cerebral arteries are patent to their distal aspects without significant stenosis. No M1 stenosis or occlusion. MCA branches  perfused to their distal aspects without significant stenosis. Posterior circulation: Vertebral arteries patent to the vertebrobasilar junction without significant stenosis. Posterior inferior cerebellar arteries patent proximally. Basilar patent to its distal aspect without significant stenosis. Superior cerebellar arteries patent proximally. Patent P1 segments. The left P1 has a common origin with the left superior cerebellar artery. Near fetal origin of the bilateral PCAs from prominent posterior  communicating arteries. PCAs perfused to their distal aspects without significant stenosis. The bilateral posterior communicating arteries are not visualized. Venous sinuses: As permitted by contrast timing, patent. Anatomic variants: Near fetal origin of the bilateral PCAs. Review of the MIP images confirms the above findings IMPRESSION: 1. No intracranial large vessel occlusion or significant stenosis. 2. No hemodynamically significant stenosis in the neck. 3. Hypodense collection in the left aspect of the oropharyngeal mucosa, with hyperdense material, concerning for active extravasation in the area of pharyngeal injury. Direct visualization is recommended. Imaging results were communicated on 03/29/2023 at 7:09 pm to provider Dr. Otelia Limes via secure text paging. Electronically Signed   By: Wiliam Ke M.D.   On: 03/29/2023 19:10   CT HEAD CODE STROKE WO CONTRAST  Result Date: 03/29/2023 CLINICAL DATA:  Code stroke. EXAM: CT HEAD WITHOUT CONTRAST TECHNIQUE: Contiguous axial images were obtained from the base of the skull through the vertex without intravenous contrast. RADIATION DOSE REDUCTION: This exam was performed according to the departmental dose-optimization program which includes automated exposure control, adjustment of the mA and/or kV according to patient size and/or use of iterative reconstruction technique. COMPARISON:  10/13/2020 FINDINGS: Brain: No evidence of acute infarction, hemorrhage, mass, mass effect, or midline shift. No hydrocephalus or extra-axial collection. Vascular: No hyperdense vessel. Skull: Negative for fracture or focal lesion. Sinuses/Orbits: No acute finding. Other: The mastoid air cells are well aerated. ASPECTS [facility] Stroke Program Early CT Score) - Ganglionic level infarction (caudate, lentiform nuclei, internal capsule, insula, M1-M3 cortex): 7 - Supraganglionic infarction (M4-M6 cortex): 3 Total score (0-10 with 10 being normal): 10 IMPRESSION: No evidence of acute  intracranial abnormality. ASPECTS is 10. Imaging results were communicated on 03/29/2023 at 6:58 pm to provider Dr. Otelia Limes via secure text paging. Electronically Signed   By: Wiliam Ke M.D.   On: 03/29/2023 18:58    Procedures Procedures  {Document cardiac monitor, telemetry assessment procedure when appropriate:1}  Medications Ordered in ED Medications  iohexol (OMNIPAQUE) 350 MG/ML injection 75 mL (75 mLs Intravenous Contrast Given 03/29/23 1902)    ED Course/ Medical Decision Making/ A&P   {   Click here for ABCD2, HEART and other calculatorsREFRESH Note before signing :1}                              Medical Decision Making Amount and/or Complexity of Data Reviewed Labs: ordered. Radiology: ordered.   ***  {Document critical care time when appropriate:1} {Document review of labs and clinical decision tools ie heart score, Chads2Vasc2 etc:1}  {Document your independent review of radiology images, and any outside records:1} {Document your discussion with family members, caretakers, and with consultants:1} {Document social determinants of health affecting pt's care:1} {Document your decision making why or why not admission, treatments were needed:1} Final Clinical Impression(s) / ED Diagnoses Final diagnoses:  None    Rx / DC Orders ED Discharge Orders     None

## 2023-03-29 NOTE — ED Notes (Signed)
Pt states that she needs to leave at this time. MD Lynelle Doctor made aware.

## 2023-03-29 NOTE — ED Triage Notes (Addendum)
Pt BIB EMS from home. Reports that pt was on a 46ft ladder and felt dizzy and numbness and called her friend. Upon arrival pt was lethargic, able to follow few commands, L sided paralysis in arm and leg, eyes deviated to the right, slurred speech, and not answering questions. After CT scan in ED pt began answering questions and states that she did not fall off of the ladder and states that she got down from the ladder and wrote down her information so that emergency services would have her information. Reports a hx of seizures and rotator cuff surgery on L arm as well as a lump on her neck with needed repair.

## 2023-03-29 NOTE — Consult Note (Signed)
NEURO HOSPITALIST CONSULT NOTE   Requestig physician: Dr. Lynelle Doctor  Reason for Consult: Acute onset of left sided weakness, trouble speaking and rightward gaze deviation  History obtained from:  EMS and Chart     HPI:                                                                                                                                          Janet Cohen is a 57 y.o. female with a PMHx of anemia, anxiety, chronic bilateral shoulder pain, depression, DVT secondary to LLE trauma in 2016, eating disorder, HTN and seizures (last seizure 2 years ago per patient) who presents to the ED via EMS for assessment of new-onset left sided weakness, trouble speaking and rightward gaze deviation. LKN was 1755 when she texted a neighbor stating that she was up on a ladder and was feeling dizzy. Her neighbor, who is an MD, instructed her to call EMS. The patient then got down off the ladder without falling, sat on the couch and called EMS. On EMS arrival they noted left sided weakness, trouble speaking and rightward gaze deviation. Vitals per EMS: BP 124/86, HR 110, afebrile, RR 18, CBG 118. Code Stroke was called in the field. On arrival to the ED she was mute and not moving her left side to command.    Past Medical History:  Diagnosis Date   Anemia    Anxiety    Back problem    PAIN WHEN LIFTING   Chronic shoulder pain    BOTH SHOULDER HAVE ROTATOR CUFF TEAR   Depression    DVT (deep venous thrombosis) (HCC) 2016   related to injury with a tractor.  had left leg trauma.   Eating disorder    Hx of obesity    Hypertension    Seizures (HCC)    LAST SEIZURE YRS AGO    Past Surgical History:  Procedure Laterality Date   CYSTOSCOPY N/A 09/22/2018   Procedure: CYSTOSCOPY;  Surgeon: Patton Salles, MD;  Location: [facility];  Service: Gynecology;  Laterality: N/A;   KNEE SURGERY     RIGHT   LAPAROSCOPIC LYSIS OF ADHESIONS N/A 09/22/2018    Procedure: LAPAROSCOPIC LYSIS OF ADHESIONS;  Surgeon: Patton Salles, MD;  Location: [facility];  Service: Gynecology;  Laterality: N/A;   MULTIPLE TOOTH EXTRACTIONS     TOTAL LAPAROSCOPIC HYSTERECTOMY WITH SALPINGECTOMY Bilateral 09/22/2018   Procedure: TOTAL LAPAROSCOPIC HYSTERECTOMY WITH SALPINGECTOMY;  Surgeon: Patton Salles, MD;  Location: [facility];  Service: Gynecology;  Laterality: Bilateral;  Possible BSO. Extended recovery bed needed.    Family History  Problem Relation Age of Onset   Depression Mother    Post-traumatic stress disorder Mother  Mesothelioma Father    Cancer Sister        cervical cancer 2010   Breast cancer Maternal Aunt 40       breast cancer             Social History:  reports that she has been smoking cigarettes. She started smoking about 15 years ago. She has a 20 pack-year smoking history. She has never used smokeless tobacco. She reports that she does not drink alcohol and does not use drugs.  Allergies  Allergen Reactions   Ibuprofen Anaphylaxis   Tramadol Hives   Bee Venom    Dilaudid [Hydromorphone Hcl] Other (See Comments)    Made patient extremely "Hyper" like she was superwoman   Gabapentin Hives   Naproxen Hives   Penicillins Swelling    Did it involve swelling of the face/tongue/throat, SOB, or low BP? Yes Did it involve sudden or severe rash/hives, skin peeling, or any reaction on the inside of your mouth or nose? No Did you need to seek medical attention at a hospital or doctor's office? Yes When did it last happen?   3 years ago    Tylenol [Acetaminophen] Nausea And Vomiting   Vicodin [Hydrocodone-Acetaminophen] Itching   Toradol [Ketorolac Tromethamine] Other (See Comments)    Syncope per pt    MEDICATIONS:                                                                                                                      No current facility-administered medications on file  prior to encounter.   Current Outpatient Medications on File Prior to Encounter  Medication Sig Dispense Refill   alprazolam (XANAX) 2 MG tablet Take 2 mg by mouth daily as needed for anxiety.      amphetamine-dextroamphetamine (ADDERALL) 20 MG tablet Take 20 mg by mouth daily.     cetirizine (ZYRTEC) 10 MG tablet Take 10 mg by mouth daily.     cyanocobalamin (,VITAMIN B-12,) 1000 MCG/ML injection Inject 1,000 mcg into the muscle every 30 (thirty) days.      divalproex (DEPAKOTE ER) 500 MG 24 hr tablet Take 2 tablets (1,000 mg total) by mouth at bedtime. 60 tablet 11   doxepin (SINEQUAN) 25 MG capsule Take 1 capsule (25 mg total) by mouth at bedtime. 14 capsule 0   escitalopram (LEXAPRO) 20 MG tablet Take 20 mg by mouth daily.     lisinopril (PRINIVIL,ZESTRIL) 20 MG tablet Take 20 mg by mouth daily. RAN OUT OF BP MEDS 3 DAYS AGO     Multiple Vitamins-Minerals (MULTIVITAMIN ADULT PO) Take 1 tablet by mouth 3 (three) times daily.      oxyCODONE-acetaminophen (PERCOCET) 10-325 MG tablet as needed.     rizatriptan (MAXALT-MLT) 10 MG disintegrating tablet Take 1 tablet (10 mg total) by mouth as needed. May repeat in 2 hours if needed 15 tablet 6     ROS:  Unable to obtain due to lack of verbal output.    Weight 62.4 kg, last menstrual period 09/17/2018.   General Examination:                                                                                                       Physical Exam  HEENT:  [place]/AT. C-collar in place.  Lungs: Respirations unlabored Extremities: Warm and well perfused.   Neurological Examination Examination ongoing from bridge to CT, evolving in the following order: Mental Status: Awake, eyes open, not responding to questions or commands initially. With repeated requests and coaching, she will follow some motor commands. Nonverbal. Will  glance at examiners during assessment.   Cranial Nerves: II: Blinks to threat bilaterally.   III,IV, VI: No ptosis. Eyes are midline on arrival. Will look to the left and right in response to examiners.  V: Blinks to eyelid stimulation bilaterally VII: Perioral muscles/lips are mildly asymmetric but without definite weakness.  VIII: Alerts to voice. IX,X: Gag reflex deferred XI: Head is midline XII: Not following commands for assessment Motor/Sensory: In CT, she begins to respond to some commands.  Will grip with LUE and flex extend with 4/5 strength RUE 5/5 proximally and distally with poor effort RLE flexes at hip and knee in response to noxious plantar stimulation. Will give some resistance in knee extension with coaching.  LLE with internal rotation at hip and ankle inversion, with increased tone in these muscle groups. Does not move to command or light noxious plantar stimulation.  Deep Tendon Reflexes: 1+ and symmetric throughout. Toes downgoing bilaterally.  Cerebellar: No ataxia with FNF on the right. Does not perform on the left.  Gait: Deferred    Lab Results: Basic Metabolic Panel: Recent Labs  Lab 03/29/23 1850  NA 141  K 3.4*    CBC: Recent Labs  Lab 03/29/23 1850  HGB 12.9  HCT 38.0    Cardiac Enzymes: No results for input(s): "CKTOTAL", "CKMB", "CKMBINDEX", "TROPONINI" in the last 168 hours.  Lipid Panel: No results for input(s): "CHOL", "TRIG", "HDL", "CHOLHDL", "VLDL", "LDLCALC" in the last 168 hours.  Imaging: No results found.   Assessment: 57 year old female presenting to [facility] with acute onset of left sided weakness, trouble speaking and rightward gaze deviation - Exam reveals decreased movement on the left which is equivocal for volitional component versus lesional paralysis. Patient is mute but does start to follow some commands, right more than left, in CT.  - CT head: No evidence of acute intracranial abnormality. ASPECTS is 10.  - CT neck: No  acute cervical spine fracture.  - CTA of head and neck: No intracranial large vessel occlusion or significant stenosis. No hemodynamically significant stenosis in the neck. Hypodense collection in the left aspect of the oropharyngeal mucosa, with hyperdense material, concerning for active extravasation in the area of pharyngeal injury. Direct visualization is recommended.  - Overall presentation is not strongly consistent with acute stroke. MRI being considered.   Addendum: - After being brought to examination room after CT, the patient begins speaking, becoming fully fluent  with intact comprehension and naming, engaging normally with staff with good eye contact and full movement of her extremities, except at shoulder on the left, which she states is immobile due to previous injury. She states that the episode at home is typical of recurrent such episodes that she has been having and that they always resolve spontaneously. She does not endorse having had any diagnosis regarding the etiology of these recurrent spells.  - Functional etiology is highest on the DDx. No seizure-like activity incontinence or tongue biting to suggest that the spell today was a seizure. Her mutism on arrival in conjunction with interactions with staff at the bridge during initial assessment did not have a quality militating in favor of a postictal state.  - Outpatient records reviewed and she did have a visit with neurology back in May 2022. 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  - Later, after Neurology examination was completed. The patient was ambulating in the hall and requesting to be discharged AMA.    Recommendations: - From a neurological standpoint, there are no further recommendations and she possibly can be discharged. However, please note the following from Radiology report: "Hypodense collection in the left aspect of the oropharyngeal mucosa, with hyperdense material, concerning for  active extravasation in the area of pharyngeal injury. Direct visualization is recommended."  - Will need outpatient Neurology follow up.  - Communicated to EDP via SecureChat.    Electronically signed: Dr. Caryl Pina 03/29/2023, 6:51 PM

## 2023-03-29 NOTE — ED Provider Notes (Signed)
I saw and evaluated the patient, reviewed the resident's note and I agree with the findings and plan.  EKG Interpretation Date/Time:  Saturday March 29 2023 19:29:56 EDT Ventricular Rate:  76 PR Interval:  182 QRS Duration:  96 QT Interval:  402 QTC Calculation: 452 R Axis:   62  Text Interpretation: Sinus rhythm RSR' in V1 or V2, right VCD or RVH No significant change since last tracing Confirmed by Linwood Dibbles [phone]) on 03/29/2023 8:27:45 PM  Patient initially presented to the ED for evaluation of a possible code stroke.  There is also concern of possible trauma.  The report was that patient was on a 10 foot ladder.  She started to feel dizzy.  She called her friend.  EMS arrived and they found her sitting on a couch.  Patient initially was able to hand fire rescue a card that had her name on it as well as her address and other identifying information.  EMS was also concerned about the possibility of a fall as there was not a clear report whether she ended up falling off the ladder.  Patient was activated as a code stroke.  There was also concerns for trauma although there was no definite external signs of injury.  Stroke team evaluate patient on arrival.  Patient was mostly nonverbal appeared to have left-sided hemiparesis.  However during her evaluation after her initial scans patient spontaneously started to improve.  Patient started speaking clearly moving all extremities.  Patient indicated she has a history of this type of condition.  It will often go away on its own  Patient had a CT scan of her head C-spine as well as CT angio of head and neck.  No acute findings noted.  Patient indicated her desire to leave.  Patient states she has had this before and it will usually go away after short period of time.  Outpatient records reviewed and she did have a visit with neurology back in May 2022.  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    We encouraged the patient  to stay in the ED until we could coordinate with neurology to get their further recommendations.  Patient initially agreed to stay but eventually declined any further evaluation and she ended up leaving.  Patient was able to ambulate without difficulty.  No neurologic deficits noted.   Linwood Dibbles, MD 03/29/23 2112
